# Patient Record
Sex: Female | Born: 1945 | Race: White | Hispanic: No | Smoking: Former smoker
Health system: Southern US, Community
[De-identification: ages and names within clinical notes are randomized; demographics above are authoritative.]

## PROBLEM LIST (undated history)

## (undated) DIAGNOSIS — F419 Anxiety disorder, unspecified: Secondary | ICD-10-CM

## (undated) DIAGNOSIS — M8580 Other specified disorders of bone density and structure, unspecified site: Secondary | ICD-10-CM

## (undated) DIAGNOSIS — M109 Gout, unspecified: Secondary | ICD-10-CM

## (undated) DIAGNOSIS — F32A Depression, unspecified: Secondary | ICD-10-CM

## (undated) DIAGNOSIS — T4145XA Adverse effect of unspecified anesthetic, initial encounter: Secondary | ICD-10-CM

## (undated) DIAGNOSIS — E119 Type 2 diabetes mellitus without complications: Secondary | ICD-10-CM

## (undated) DIAGNOSIS — L900 Lichen sclerosus et atrophicus: Secondary | ICD-10-CM

## (undated) DIAGNOSIS — R001 Bradycardia, unspecified: Secondary | ICD-10-CM

## (undated) DIAGNOSIS — R0600 Dyspnea, unspecified: Secondary | ICD-10-CM

## (undated) DIAGNOSIS — E785 Hyperlipidemia, unspecified: Secondary | ICD-10-CM

## (undated) DIAGNOSIS — I10 Essential (primary) hypertension: Secondary | ICD-10-CM

## (undated) DIAGNOSIS — M545 Low back pain, unspecified: Secondary | ICD-10-CM

## (undated) DIAGNOSIS — I499 Cardiac arrhythmia, unspecified: Secondary | ICD-10-CM

## (undated) DIAGNOSIS — F329 Major depressive disorder, single episode, unspecified: Secondary | ICD-10-CM

## (undated) DIAGNOSIS — K219 Gastro-esophageal reflux disease without esophagitis: Secondary | ICD-10-CM

## (undated) DIAGNOSIS — J45909 Unspecified asthma, uncomplicated: Secondary | ICD-10-CM

## (undated) DIAGNOSIS — N393 Stress incontinence (female) (male): Secondary | ICD-10-CM

## (undated) DIAGNOSIS — E669 Obesity, unspecified: Secondary | ICD-10-CM

## (undated) DIAGNOSIS — T8859XA Other complications of anesthesia, initial encounter: Secondary | ICD-10-CM

## (undated) HISTORY — DX: Lichen sclerosus et atrophicus: L90.0

## (undated) HISTORY — DX: Low back pain: M54.5

## (undated) HISTORY — PX: ABDOMINAL HYSTERECTOMY: SHX81

## (undated) HISTORY — DX: Hyperlipidemia, unspecified: E78.5

## (undated) HISTORY — DX: Gout, unspecified: M10.9

## (undated) HISTORY — DX: Bradycardia, unspecified: R00.1

## (undated) HISTORY — DX: Essential (primary) hypertension: I10

## (undated) HISTORY — DX: Depression, unspecified: F32.A

## (undated) HISTORY — DX: Type 2 diabetes mellitus without complications: E11.9

## (undated) HISTORY — DX: Anxiety disorder, unspecified: F41.9

## (undated) HISTORY — DX: Stress incontinence (female) (male): N39.3

## (undated) HISTORY — PX: CHOLECYSTECTOMY: SHX55

## (undated) HISTORY — PX: INCONTINENCE SURGERY: SHX676

## (undated) HISTORY — PX: EYE SURGERY: SHX253

## (undated) HISTORY — DX: Major depressive disorder, single episode, unspecified: F32.9

## (undated) HISTORY — DX: Obesity, unspecified: E66.9

## (undated) HISTORY — DX: Other specified disorders of bone density and structure, unspecified site: M85.80

## (undated) HISTORY — DX: Gastro-esophageal reflux disease without esophagitis: K21.9

## (undated) HISTORY — DX: Unspecified asthma, uncomplicated: J45.909

## (undated) HISTORY — PX: SINUS EXPLORATION: SHX5214

## (undated) HISTORY — DX: Low back pain, unspecified: M54.50

---

## 2006-10-23 ENCOUNTER — Ambulatory Visit: Payer: Self-pay | Admitting: Gastroenterology

## 2008-03-22 ENCOUNTER — Other Ambulatory Visit: Payer: Self-pay

## 2008-03-22 ENCOUNTER — Emergency Department: Payer: Self-pay | Admitting: Emergency Medicine

## 2010-10-05 ENCOUNTER — Ambulatory Visit: Payer: Self-pay | Admitting: Emergency Medicine

## 2011-06-01 ENCOUNTER — Emergency Department: Payer: Self-pay | Admitting: Emergency Medicine

## 2013-09-18 ENCOUNTER — Ambulatory Visit: Payer: Self-pay | Admitting: Podiatry

## 2013-10-08 ENCOUNTER — Ambulatory Visit: Payer: Self-pay | Admitting: Podiatry

## 2014-04-28 ENCOUNTER — Ambulatory Visit: Payer: Self-pay

## 2014-05-06 ENCOUNTER — Ambulatory Visit: Payer: Self-pay

## 2014-05-24 ENCOUNTER — Ambulatory Visit: Payer: Self-pay

## 2014-06-24 ENCOUNTER — Ambulatory Visit: Payer: Self-pay

## 2014-10-10 ENCOUNTER — Encounter (HOSPITAL_BASED_OUTPATIENT_CLINIC_OR_DEPARTMENT_OTHER): Payer: Medicare HMO

## 2014-11-10 DIAGNOSIS — M654 Radial styloid tenosynovitis [de Quervain]: Secondary | ICD-10-CM | POA: Diagnosis not present

## 2014-12-16 DIAGNOSIS — N182 Chronic kidney disease, stage 2 (mild): Secondary | ICD-10-CM | POA: Diagnosis not present

## 2014-12-16 DIAGNOSIS — I129 Hypertensive chronic kidney disease with stage 1 through stage 4 chronic kidney disease, or unspecified chronic kidney disease: Secondary | ICD-10-CM | POA: Diagnosis not present

## 2014-12-16 DIAGNOSIS — K649 Unspecified hemorrhoids: Secondary | ICD-10-CM | POA: Diagnosis not present

## 2015-02-18 DIAGNOSIS — K602 Anal fissure, unspecified: Secondary | ICD-10-CM | POA: Diagnosis not present

## 2015-03-09 DIAGNOSIS — I119 Hypertensive heart disease without heart failure: Secondary | ICD-10-CM | POA: Diagnosis not present

## 2015-03-09 DIAGNOSIS — E782 Mixed hyperlipidemia: Secondary | ICD-10-CM | POA: Diagnosis not present

## 2015-03-09 DIAGNOSIS — I493 Ventricular premature depolarization: Secondary | ICD-10-CM | POA: Diagnosis not present

## 2015-03-09 DIAGNOSIS — I10 Essential (primary) hypertension: Secondary | ICD-10-CM | POA: Diagnosis not present

## 2015-03-17 DIAGNOSIS — I129 Hypertensive chronic kidney disease with stage 1 through stage 4 chronic kidney disease, or unspecified chronic kidney disease: Secondary | ICD-10-CM | POA: Diagnosis not present

## 2015-03-17 DIAGNOSIS — E119 Type 2 diabetes mellitus without complications: Secondary | ICD-10-CM | POA: Diagnosis not present

## 2015-03-17 DIAGNOSIS — N182 Chronic kidney disease, stage 2 (mild): Secondary | ICD-10-CM | POA: Diagnosis not present

## 2015-03-17 DIAGNOSIS — R7301 Impaired fasting glucose: Secondary | ICD-10-CM | POA: Diagnosis not present

## 2015-06-01 ENCOUNTER — Ambulatory Visit (INDEPENDENT_AMBULATORY_CARE_PROVIDER_SITE_OTHER): Payer: Medicare HMO | Admitting: Family Medicine

## 2015-06-01 ENCOUNTER — Ambulatory Visit: Payer: Self-pay | Admitting: Unknown Physician Specialty

## 2015-06-01 ENCOUNTER — Encounter: Payer: Self-pay | Admitting: Family Medicine

## 2015-06-01 VITALS — BP 154/81 | HR 59 | Temp 97.4°F | Ht 59.0 in | Wt 148.5 lb

## 2015-06-01 DIAGNOSIS — I10 Essential (primary) hypertension: Secondary | ICD-10-CM

## 2015-06-01 DIAGNOSIS — L299 Pruritus, unspecified: Secondary | ICD-10-CM | POA: Diagnosis not present

## 2015-06-01 DIAGNOSIS — F411 Generalized anxiety disorder: Secondary | ICD-10-CM

## 2015-06-01 DIAGNOSIS — K602 Anal fissure, unspecified: Secondary | ICD-10-CM | POA: Diagnosis not present

## 2015-06-01 MED ORDER — CLOTRIMAZOLE-BETAMETHASONE 1-0.05 % EX LOTN: TOPICAL_LOTION | Freq: Two times a day (BID) | CUTANEOUS | Status: DC

## 2015-06-01 MED ORDER — DIAZEPAM 2 MG PO TABS: 2.0000 mg | ORAL_TABLET | Freq: Every day | ORAL | Status: DC

## 2015-06-01 MED ORDER — VENLAFAXINE HCL ER 37.5 MG PO CP24: 37.5000 mg | ORAL_CAPSULE | Freq: Every day | ORAL | Status: DC

## 2015-06-01 MED ORDER — CLOBETASOL PROPIONATE 0.05 % EX OINT: 1.0000 "application " | TOPICAL_OINTMENT | Freq: Two times a day (BID) | CUTANEOUS | Status: DC

## 2015-06-01 NOTE — Assessment & Plan Note (Signed)
Long discussion with patient about how valium is dangerous in elderly patients, and that I don't treat daily anxiety with benzos without a preventative medicine. She is willing to try a preventative medicine, will try effexor, with the possibility of switching to cymbalta due to vasomotor symptoms. Will continue valium for now with the goal of slowly weaning off. Continue to monitor. Follow up in 1 month.

## 2015-06-01 NOTE — Assessment & Plan Note (Signed)
Refill of steroid cream given. Not to be used on the face.

## 2015-06-01 NOTE — Assessment & Plan Note (Signed)
Elevated today. Patient states that cardiology manages BP and that she doesn't want any one else touching it. Encouraged her to call cardiologist.

## 2015-06-01 NOTE — Patient Instructions (Signed)
DASH Eating Plan °DASH stands for "Dietary Approaches to Stop Hypertension." The DASH eating plan is a healthy eating plan that has been shown to reduce high blood pressure (hypertension). Additional health benefits may include reducing the risk of type 2 diabetes mellitus, heart disease, and stroke. The DASH eating plan may also help with weight loss. °WHAT DO I NEED TO KNOW ABOUT THE DASH EATING PLAN? °For the DASH eating plan, you will follow these general guidelines: °· Choose foods with a percent daily value for sodium of less than 5% (as listed on the food label). °· Use salt-free seasonings or herbs instead of table salt or sea salt. °· Check with your health care provider or pharmacist before using salt substitutes. °· Eat lower-sodium products, often labeled as "lower sodium" or "no salt added." °· Eat fresh foods. °· Eat more vegetables, fruits, and low-fat dairy products. °· Choose whole grains. Look for the word "whole" as the first word in the ingredient list. °· Choose fish and skinless chicken or turkey more often than red meat. Limit fish, poultry, and meat to 6 oz (170 g) each day. °· Limit sweets, desserts, sugars, and sugary drinks. °· Choose heart-healthy fats. °· Limit cheese to 1 oz (28 g) per day. °· Eat more home-cooked food and less restaurant, buffet, and fast food. °· Limit fried foods. °· Cook foods using methods other than frying. °· Limit canned vegetables. If you do use them, rinse them well to decrease the sodium. °· When eating at a restaurant, ask that your food be prepared with less salt, or no salt if possible. °WHAT FOODS CAN I EAT? °Seek help from a dietitian for individual calorie needs. °Grains °Whole grain or whole wheat bread. Brown rice. Whole grain or whole wheat pasta. Quinoa, bulgur, and whole grain cereals. Low-sodium cereals. Corn or whole wheat flour tortillas. Whole grain cornbread. Whole grain crackers. Low-sodium crackers. °Vegetables °Fresh or frozen vegetables  (raw, steamed, roasted, or grilled). Low-sodium or reduced-sodium tomato and vegetable juices. Low-sodium or reduced-sodium tomato sauce and paste. Low-sodium or reduced-sodium canned vegetables.  °Fruits °All fresh, canned (in natural juice), or frozen fruits. °Meat and Other Protein Products °Ground beef (85% or leaner), grass-fed beef, or beef trimmed of fat. Skinless chicken or turkey. Ground chicken or turkey. Pork trimmed of fat. All fish and seafood. Eggs. Dried beans, peas, or lentils. Unsalted nuts and seeds. Unsalted canned beans. °Dairy °Low-fat dairy products, such as skim or 1% milk, 2% or reduced-fat cheeses, low-fat ricotta or cottage cheese, or plain low-fat yogurt. Low-sodium or reduced-sodium cheeses. °Fats and Oils °Tub margarines without trans fats. Light or reduced-fat mayonnaise and salad dressings (reduced sodium). Avocado. Safflower, olive, or canola oils. Natural peanut or almond butter. °Other °Unsalted popcorn and pretzels. °The items listed above may not be a complete list of recommended foods or beverages. Contact your dietitian for more options. °WHAT FOODS ARE NOT RECOMMENDED? °Grains °White bread. White pasta. White rice. Refined cornbread. Bagels and croissants. Crackers that contain trans fat. °Vegetables °Creamed or fried vegetables. Vegetables in a cheese sauce. Regular canned vegetables. Regular canned tomato sauce and paste. Regular tomato and vegetable juices. °Fruits °Dried fruits. Canned fruit in light or heavy syrup. Fruit juice. °Meat and Other Protein Products °Fatty cuts of meat. Ribs, chicken wings, bacon, sausage, bologna, salami, chitterlings, fatback, hot dogs, bratwurst, and packaged luncheon meats. Salted nuts and seeds. Canned beans with salt. °Dairy °Whole or 2% milk, cream, half-and-half, and cream cheese. Whole-fat or sweetened yogurt. Full-fat   cheeses or blue cheese. Nondairy creamers and whipped toppings. Processed cheese, cheese spreads, or cheese  curds. °Condiments °Onion and garlic salt, seasoned salt, table salt, and sea salt. Canned and packaged gravies. Worcestershire sauce. Tartar sauce. Barbecue sauce. Teriyaki sauce. Soy sauce, including reduced sodium. Steak sauce. Fish sauce. Oyster sauce. Cocktail sauce. Horseradish. Ketchup and mustard. Meat flavorings and tenderizers. Bouillon cubes. Hot sauce. Tabasco sauce. Marinades. Taco seasonings. Relishes. °Fats and Oils °Butter, stick margarine, lard, shortening, ghee, and bacon fat. Coconut, palm kernel, or palm oils. Regular salad dressings. °Other °Pickles and olives. Salted popcorn and pretzels. °The items listed above may not be a complete list of foods and beverages to avoid. Contact your dietitian for more information. °WHERE CAN I FIND MORE INFORMATION? °National Heart, [employer], and [employer] Institute: www.nhlbi.nih.gov/health/health-topics/topics/dash/ °Document Released: 09/29/2011 Document Revised: 02/24/2014 Document Reviewed: 08/14/2013 °ExitCare® Patient Information ©2015 [employer], [employer]. This information is not intended to replace advice given to you by your health care provider. Make sure you discuss any questions you have with your health care provider. ° °

## 2015-06-01 NOTE — Assessment & Plan Note (Signed)
Stable on current regimen. Continue current regimen. Continue to monitor.  

## 2015-06-01 NOTE — Progress Notes (Signed)
BP 154/81 mmHg  Pulse 59  Temp(Src) 97.4 F (36.3 C)  Ht 4\' 11"  (1.499 m)  Wt 148 lb 8 oz (67.359 kg)  BMI 29.98 kg/m2  SpO2 99%   Subjective:    Patient ID: Martha Singleton, female    DOB: 05-18-1946, 69 y.o.   MRN: [id number]  HPI: Martha Singleton is a 69 y.o. female  Chief Complaint  Patient presents with  . itchy ear    patient has had a script in the past Clotrimazole and betamethasone, she would like a refill in a 90day supply  . Hearing Loss  . Anal Fissure    patient would like a 90 day supply on clobetasol ointment   HYPERTENSION- has been going to see the cardiologist. Does not want 2 doctors dealing with her blood pressure. She is going to be seeing him for that and doesn't want [place] to change anything Hypertension status: stable  Satisfied with current treatment? yes Duration of hypertension: chronic BP monitoring frequency:  not checking BP medication side effects:  no Medication compliance: excellent compliance Aspirin: no Recurrent headaches: no Visual changes: no Palpitations: yes Dyspnea: no Chest pain: no Lower extremity edema: no Dizzy/lightheaded: no  ANXIETY/STRESS- was on 20mg  of the citalopram, but notes that it made her arm shake, so she stopped taking it. She weaned herself off. Anxiety happens at different times. Not in the AM or just at night. Feels like her heart races and skips beats. When she was on the premarin, she felt good. But now feels out of control.  Duration:uncontrolled Anxious mood: yes  Excessive worrying: yes Irritability: no  Sweating: no Nausea: no Palpitations:yes Hyperventilation: no Panic attacks: yes Agoraphobia: no  Obscessions/compulsions: no  Medication has been working well for her ears and it has been working well. Has been under good control for her.  Anal fissure has been bleeding a bit- usually happens with 2+ BM a day, needs medication.   Relevant past medical, surgical, family and social history reviewed  and updated as indicated. Interim medical history since our last visit reviewed. Allergies and medications reviewed and updated.  Review of Systems  Constitutional: Negative.   Respiratory: Negative.   Cardiovascular: Negative.   Endocrine: Negative.   Psychiatric/Behavioral: Negative for suicidal ideas, hallucinations, behavioral problems, confusion, sleep disturbance, self-injury, dysphoric mood, decreased concentration and agitation. The patient is nervous/anxious. The patient is not hyperactive.     Per HPI unless specifically indicated above     Objective:    BP 154/81 mmHg  Pulse 59  Temp(Src) 97.4 F (36.3 C)  Ht 4\' 11"  (1.499 m)  Wt 148 lb 8 oz (67.359 kg)  BMI 29.98 kg/m2  SpO2 99%  Wt Readings from Last 3 Encounters:  06/01/15 148 lb 8 oz (67.359 kg)  03/17/15 145 lb (65.772 kg)    Physical Exam  Constitutional: She is oriented to person, place, and time. She appears well-developed and well-nourished. No distress.  HENT:  Head: Normocephalic and atraumatic.  Right Ear: Hearing normal.  Left Ear: Hearing normal.  Nose: Nose normal.  Eyes: Conjunctivae and lids are normal. Right eye exhibits no discharge. Left eye exhibits no discharge. No scleral icterus.  Cardiovascular: Normal rate, regular rhythm and normal heart sounds.  Exam reveals no gallop and no friction rub.   No murmur heard. Pulmonary/Chest: Effort normal and breath sounds normal. No respiratory distress. She has no wheezes. She has no rales.  Musculoskeletal: Normal range of motion.  Neurological: She is alert  and oriented to person, place, and time.  Skin: Skin is warm, dry and intact. No rash noted. No erythema. No pallor.  Psychiatric: She has a normal mood and affect. Her speech is normal and behavior is normal. Judgment and thought content normal. Cognition and memory are normal.  Nursing note and vitals reviewed.   No results found for this or any previous visit.    Assessment & Plan:    Problem List Items Addressed This Visit      Cardiovascular and Mediastinum   Essential hypertension    Elevated today. Patient states that cardiology manages BP and that she doesn't want any one else touching it. Encouraged her to call cardiologist.       Relevant Orders   Ambulatory referral to Cardiology     Digestive   Anal fissure    Stable on current regimen. Continue current regimen. Continue to monitor.         Other   Generalized anxiety disorder - Primary    Long discussion with patient about how valium is dangerous in elderly patients, and that I don't treat daily anxiety with benzos without a preventative medicine. She is willing to try a preventative medicine, will try effexor, with the possibility of switching to cymbalta due to vasomotor symptoms. Will continue valium for now with the goal of slowly weaning off. Continue to monitor. Follow up in 1 month.       Itchy skin    Refill of steroid cream given. Not to be used on the face.           Follow up plan: Return in about 4 weeks (around 06/29/2015).

## 2015-06-02 ENCOUNTER — Telehealth: Payer: Self-pay | Admitting: Family Medicine

## 2015-06-02 MED ORDER — VENLAFAXINE HCL ER 37.5 MG PO CP24: 37.5000 mg | ORAL_CAPSULE | Freq: Every day | ORAL | Status: DC

## 2015-06-02 NOTE — Telephone Encounter (Signed)
Rx sent to her pharmacy 

## 2015-06-02 NOTE — Telephone Encounter (Signed)
Pt called in and would like to have rx sent to [facility] [address] instead of [facility] until we make sure the medication is working.

## 2015-06-18 ENCOUNTER — Other Ambulatory Visit: Payer: Self-pay | Admitting: Family Medicine

## 2015-07-02 ENCOUNTER — Ambulatory Visit: Payer: Medicare HMO | Admitting: Family Medicine

## 2015-07-09 ENCOUNTER — Encounter: Payer: Self-pay | Admitting: Family Medicine

## 2015-07-09 ENCOUNTER — Ambulatory Visit (INDEPENDENT_AMBULATORY_CARE_PROVIDER_SITE_OTHER): Payer: Commercial Managed Care - HMO | Admitting: Family Medicine

## 2015-07-09 VITALS — BP 166/78 | HR 70 | Wt 150.0 lb

## 2015-07-09 DIAGNOSIS — R001 Bradycardia, unspecified: Secondary | ICD-10-CM | POA: Diagnosis not present

## 2015-07-09 DIAGNOSIS — T148 Other injury of unspecified body region: Secondary | ICD-10-CM | POA: Diagnosis not present

## 2015-07-09 DIAGNOSIS — F411 Generalized anxiety disorder: Secondary | ICD-10-CM | POA: Diagnosis not present

## 2015-07-09 DIAGNOSIS — I10 Essential (primary) hypertension: Secondary | ICD-10-CM

## 2015-07-09 DIAGNOSIS — W57XXXA Bitten or stung by nonvenomous insect and other nonvenomous arthropods, initial encounter: Secondary | ICD-10-CM | POA: Diagnosis not present

## 2015-07-09 MED ORDER — CLOTRIMAZOLE-BETAMETHASONE 1-0.05 % EX LOTN: TOPICAL_LOTION | Freq: Two times a day (BID) | CUTANEOUS | Status: DC

## 2015-07-09 MED ORDER — DOXYCYCLINE HYCLATE 100 MG PO TABS: 100.0000 mg | ORAL_TABLET | Freq: Two times a day (BID) | ORAL | Status: DC

## 2015-07-09 MED ORDER — VENLAFAXINE HCL 25 MG PO TABS: 25.0000 mg | ORAL_TABLET | Freq: Two times a day (BID) | ORAL | Status: DC

## 2015-07-09 NOTE — Patient Instructions (Signed)
Lyme Disease You may have been bitten by a tick and are to watch for the development of Lyme Disease. Lyme Disease is an infection that is caused by a bacteria The bacteria causing this disease is named Borreilia burgdorferi. If a tick is infected with this bacteria and then bites you, then Lyme Disease may occur. These ticks are carried by deer and rodents such as rabbits and mice and infest grassy as well as forested areas. Fortunately most tick bites do not cause Lyme Disease.  Lyme Disease is easier to prevent than to treat. First, covering your legs with clothing when walking in areas where ticks are possibly abundant will prevent their attachment because ticks tend to stay within inches of the ground. Second, using insecticides containing DEET can be applied on skin or clothing. Last, because it takes about 12 to 24 hours for the tick to transmit the disease after attachment to the human host, you should inspect your body for ticks twice a day when you are in areas where Lyme Disease is common. You must look thoroughly when searching for ticks. The Ixodes tick that carries Lyme Disease is very small. It is around the size of a sesame seed (picture of tick is not actual size). Removal is best done by grasping the tick by the head and pulling it out. Do not to squeeze the body of the tick. This could inject the infecting bacteria into the bite site. Wash the area of the bite with an antiseptic solution after removal.  Lyme Disease is a disease that may affect many body systems. Because of the small size of the biting tick, most people do not notice being bitten. The first sign of an infection is usually a round red rash that extends out from the center of the tick bite. The center of the lesion may be blood colored (hemorrhagic) or have tiny blisters (vesicular). Most lesions have bright red outer borders and partial central clearing. This rash may extend out many inches in diameter, and multiple lesions may  be present. Other symptoms such as fatigue, headaches, chills and fever, general achiness and swelling of lymph glands may also occur. If this first stage of the disease is left untreated, these symptoms may gradually resolve by themselves, or progressive symptoms may occur because of spread of infection to other areas of the body.  Follow up with your caregiver to have testing and treatment if you have a tick bite and you develop any of the above complaints. Your caregiver may recommend preventative (prophylactic) medications which kill bacteria (antibiotics). Once a diagnosis of Lyme Disease is made, antibiotic treatment is highly likely to cure the disease. Effective treatment of late stage Lyme Disease may require longer courses of antibiotic therapy.  MAKE SURE YOU:   Understand these instructions.  Will watch your condition.  Will get help right away if you are not doing well or get worse. Document Released: 01/16/2001 Document Revised: 01/02/2012 Document Reviewed: 03/20/2009 ExitCare Patient Information 2015 [employer], LLC. This information is not intended to replace advice given to you by your health care provider. Make sure you discuss any questions you have with your health care provider.  

## 2015-07-09 NOTE — Assessment & Plan Note (Signed)
Patient was very bradycardic when she come in and was essentially asymptomatic with the exception of some light headedness. When she lay down,  HR increased. No changes on EKG from prior. Spoke to her cardiologist, [name]. He will see her on Monday for follow up. Advised her to walk on pulse ox around the office 2x, if HR stabilizes, likely due to PVCs- HR stabilized and got up to 100bpm. She felt well. Discussed warning signs for which she should go to the ER/call 911.

## 2015-07-09 NOTE — Assessment & Plan Note (Signed)
Did not do well on the long-acting effexor, will try lower dose short acting medicine. Will take 25mg  qAM x first couple of days to see how she does on it, and then will increase to 1 BID. Recheck in 1 month.

## 2015-07-09 NOTE — Assessment & Plan Note (Signed)
Will check for tick-bourne illnesses, but given that she had a bulls eye and is now having migrating arthralgias, we will treat her with doxy. Advised her that she may feel worse over the next couple of days when she starts the medication, she is aware.

## 2015-07-09 NOTE — Assessment & Plan Note (Signed)
Elevated again today- wants to have this managed by cardiology only. Seeing them on Monday.

## 2015-07-09 NOTE — Progress Notes (Signed)
BP 166/78 mmHg  Pulse 70  Wt 150 lb (68.04 kg)  SpO2 97%   Subjective:    Patient ID: Martha Singleton, female    DOB: [birth year], 69 y.o.   MRN: [id number]  HPI: Martha Singleton is a 69 y.o. female  No chief complaint on file.  Effexor made her really sleepy, slept for 2 days, doesn't want to do the ER, but would be OK trying a short acting medicine.  ANXIETY/STRESS Duration: Chronic Status:stable Anxious mood: yes  Excessive worrying: yes Irritability: no  Sweating: no Nausea: no Palpitations:no Hyperventilation: no Panic attacks: no Agoraphobia: no  Obscessions/compulsions: no Depressed mood: no .Anhedonia: no Weight changes: no Insomnia: no   Hypersomnia: no Fatigue/loss of energy: yes Feelings of worthlessness: no Feelings of guilt: no Impaired concentration/indecisiveness: no Suicidal ideations: no  Crying spells: no Recent Stressors/Life Changes: no   Got a tick bite at the beginning of the summer, had a bull's eye ring, had pain in her R leg, felt like nerve pain, thought it was shingles, but never was red,   Relevant past medical, surgical, family and social history reviewed and updated as indicated. Interim medical history since our last visit reviewed. Allergies and medications reviewed and updated.  Review of Systems  Constitutional: Negative.   Respiratory: Negative.   Cardiovascular: Negative.   Gastrointestinal: Negative.   Musculoskeletal: Negative.   Psychiatric/Behavioral: Negative.     Per HPI unless specifically indicated above     Objective:    BP 166/78 mmHg  Pulse 70  Wt 150 lb (68.04 kg)  SpO2 97%  Wt Readings from Last 3 Encounters:  07/09/15 150 lb (68.04 kg)  06/01/15 148 lb 8 oz (67.359 kg)  03/17/15 145 lb (65.772 kg)    Physical Exam  Constitutional: She is oriented to person, place, and time. She appears well-developed and well-nourished. No distress.  HENT:  Head: Normocephalic and atraumatic.  Right Ear: Hearing  normal.  Left Ear: Hearing normal.  Nose: Nose normal.  Eyes: Conjunctivae and lids are normal. Right eye exhibits no discharge. Left eye exhibits no discharge. No scleral icterus.  Cardiovascular: Intact distal pulses.  Exam reveals no gallop and no friction rub.   Murmur heard. Numerous PVCs  Pulmonary/Chest: Effort normal. No respiratory distress. She has no wheezes. She has no rales. She exhibits no tenderness.  Musculoskeletal: Normal range of motion.  Neurological: She is alert and oriented to person, place, and time.  Skin: Skin is warm, dry and intact. No rash noted. No erythema. No pallor.  Psychiatric: She has a normal mood and affect. Her speech is normal and behavior is normal. Judgment and thought content normal. Cognition and memory are normal.  Nursing note and vitals reviewed.   No results found for this or any previous visit.    Assessment & Plan:   Problem List Items Addressed This Visit      Cardiovascular and Mediastinum   Essential hypertension    Elevated again today- wants to have this managed by cardiology only. Seeing them on Monday.         Musculoskeletal and Integument   Tick bite    Will check for tick-bourne illnesses, but given that she had a bulls eye and is now having migrating arthralgias, we will treat her with doxy. Advised her that she may feel worse over the next couple of days when she starts the medication, she is aware.       Relevant Orders   Comprehensive metabolic  panel   Lyme Ab/Western Blot Reflex   Rocky mtn spotted fvr abs pnl(IgG+IgM)   Babesia microti Antibody Panel   Ehrlichia Antibody Panel     Other   Generalized anxiety disorder    Did not do well on the long-acting effexor, will try lower dose short acting medicine. Will take 25mg  qAM x first couple of days to see how she does on it, and then will increase to 1 BID. Recheck in 1 month.       Bradycardia - Primary    Patient was very bradycardic when she come in and  was essentially asymptomatic with the exception of some light headedness. When she lay down,  HR increased. No changes on EKG from prior. Spoke to her cardiologist, [name]. He will see her on Monday for follow up. Advised her to walk on pulse ox around the office 2x, if HR stabilizes, likely due to PVCs- HR stabilized and got up to 100bpm. She felt well. Discussed warning signs for which she should go to the ER/call 911.       Relevant Orders   EKG 12-Lead (Completed)   CBC With Differential/Platelet   Comprehensive metabolic panel   TSH       Follow up plan: Return in about 4 weeks (around 08/06/2015) for Anxiety follow up.      4

## 2015-07-10 LAB — COMPREHENSIVE METABOLIC PANEL
ALT: 19 IU/L (ref 0–32)
AST: 17 IU/L (ref 0–40)
Albumin/Globulin Ratio: 1.6 (ref 1.1–2.5)
Albumin: 4.3 g/dL (ref 3.6–4.8)
Alkaline Phosphatase: 82 IU/L (ref 39–117)
BUN/Creatinine Ratio: 21 (ref 11–26)
BUN: 18 mg/dL (ref 8–27)
Bilirubin Total: 0.9 mg/dL (ref 0.0–1.2)
CALCIUM: 9.7 mg/dL (ref 8.7–10.3)
CO2: 25 mmol/L (ref 18–29)
CREATININE: 0.86 mg/dL (ref 0.57–1.00)
Chloride: 100 mmol/L (ref 97–108)
GFR calc Af Amer: 80 mL/min/{1.73_m2} (ref >59)
GFR, EST NON AFRICAN AMERICAN: 69 mL/min/{1.73_m2} (ref >59)
GLOBULIN, TOTAL: 2.7 g/dL (ref 1.5–4.5)
GLUCOSE: 98 mg/dL (ref 65–99)
Potassium: 4.1 mmol/L (ref 3.5–5.2)
SODIUM: 142 mmol/L (ref 134–144)
Total Protein: 7 g/dL (ref 6.0–8.5)

## 2015-07-10 LAB — CBC WITH DIFFERENTIAL/PLATELET
HEMATOCRIT: 40.5 % (ref 34.0–46.6)
Hemoglobin: 13.9 g/dL (ref 11.1–15.9)
LYMPHS ABS: 2.7 10*3/uL (ref 0.7–3.1)
LYMPHS: 33 %
MCH: 30.3 pg (ref 26.6–33.0)
MCHC: 34.3 g/dL (ref 31.5–35.7)
MCV: 88 fL (ref 79–97)
MID (ABSOLUTE): 0.7 10*3/uL (ref 0.1–1.6)
MID: 8 %
Neutrophils Absolute: 5 10*3/uL (ref 1.4–7.0)
Neutrophils: 59 %
Platelets: 295 10*3/uL (ref 150–379)
RBC: 4.58 x10E6/uL (ref 3.77–5.28)
RDW: 13.7 % (ref 12.3–15.4)
WBC: 8.4 10*3/uL (ref 3.4–10.8)

## 2015-07-10 LAB — EHRLICHIA ANTIBODY PANEL
E. CHAFFEENSIS (HME) IGM TITER: NEGATIVE
E. CHAFFEENSIS IGG AB: NEGATIVE
HGE IGG TITER: NEGATIVE
HGE IgM Titer: NEGATIVE

## 2015-07-10 LAB — TSH: TSH: 1.47 u[IU]/mL (ref 0.450–4.500)

## 2015-07-10 LAB — LYME AB/WESTERN BLOT REFLEX

## 2015-07-11 LAB — ROCKY MTN SPOTTED FVR ABS PNL(IGG+IGM)
RMSF IgG: NEGATIVE
RMSF IgM: 1.05 index — ABNORMAL HIGH (ref 0.00–0.89)

## 2015-07-11 LAB — BABESIA MICROTI ANTIBODY PANEL
Babesia microti IgG: <1:10 {titer}
Babesia microti IgM: <1:10 {titer}

## 2015-07-13 DIAGNOSIS — I10 Essential (primary) hypertension: Secondary | ICD-10-CM | POA: Diagnosis not present

## 2015-07-13 DIAGNOSIS — R001 Bradycardia, unspecified: Secondary | ICD-10-CM | POA: Diagnosis not present

## 2015-07-13 DIAGNOSIS — E782 Mixed hyperlipidemia: Secondary | ICD-10-CM | POA: Diagnosis not present

## 2015-07-13 DIAGNOSIS — I38 Endocarditis, valve unspecified: Secondary | ICD-10-CM | POA: Diagnosis not present

## 2015-07-13 DIAGNOSIS — I119 Hypertensive heart disease without heart failure: Secondary | ICD-10-CM | POA: Diagnosis not present

## 2015-07-13 DIAGNOSIS — I493 Ventricular premature depolarization: Secondary | ICD-10-CM | POA: Diagnosis not present

## 2015-07-16 DIAGNOSIS — I493 Ventricular premature depolarization: Secondary | ICD-10-CM | POA: Diagnosis not present

## 2015-07-16 DIAGNOSIS — R001 Bradycardia, unspecified: Secondary | ICD-10-CM | POA: Diagnosis not present

## 2015-07-20 DIAGNOSIS — I10 Essential (primary) hypertension: Secondary | ICD-10-CM | POA: Diagnosis not present

## 2015-07-20 DIAGNOSIS — I493 Ventricular premature depolarization: Secondary | ICD-10-CM | POA: Diagnosis not present

## 2015-07-20 DIAGNOSIS — R001 Bradycardia, unspecified: Secondary | ICD-10-CM | POA: Diagnosis not present

## 2015-07-20 DIAGNOSIS — E782 Mixed hyperlipidemia: Secondary | ICD-10-CM | POA: Diagnosis not present

## 2015-07-27 ENCOUNTER — Telehealth: Payer: Self-pay

## 2015-07-27 ENCOUNTER — Telehealth: Payer: Self-pay | Admitting: Family Medicine

## 2015-07-27 MED ORDER — LOSARTAN POTASSIUM-HCTZ 50-12.5 MG PO TABS: 1.0000 | ORAL_TABLET | Freq: Every day | ORAL | Status: DC

## 2015-07-27 NOTE — Telephone Encounter (Signed)
I filled Losartan, not Diazepam.  Since she is with Dr. Wynetta Emery now, I am not comfortable prescribing Diazepam

## 2015-07-27 NOTE — Telephone Encounter (Signed)
Losartan: [facility] 90 day supply  Diazepam: [facility]

## 2015-07-27 NOTE — Telephone Encounter (Signed)
Did you send the Diazepam to [facility]? It was to go to [place]

## 2015-07-27 NOTE — Telephone Encounter (Signed)
Done. Thanks.

## 2015-07-27 NOTE — Telephone Encounter (Signed)
Please refill Diazepam

## 2015-07-29 ENCOUNTER — Other Ambulatory Visit: Payer: Self-pay | Admitting: Family Medicine

## 2015-07-29 MED ORDER — DIAZEPAM 2 MG PO TABS: 2.0000 mg | ORAL_TABLET | Freq: Every day | ORAL | Status: DC

## 2015-07-29 NOTE — Telephone Encounter (Signed)
Unfortunately, I do not send any controlled substances to mail order.

## 2015-07-29 NOTE — Telephone Encounter (Signed)
Patient called, she states that she can get the diazepam from mail order cheaper than local pharmacy. She would like to know if you could write this for her and specify to fill monthly it would save her a lot of money.

## 2015-07-29 NOTE — Telephone Encounter (Signed)
Spoke with Dr.Johnson, she does not send any controlled substances to mail order. LVM to notify patient that it will not be sent to mail order.

## 2015-08-06 ENCOUNTER — Other Ambulatory Visit: Payer: Self-pay | Admitting: Unknown Physician Specialty

## 2015-08-06 NOTE — Telephone Encounter (Signed)
I think you are seeing her now?

## 2015-08-07 ENCOUNTER — Ambulatory Visit: Payer: Medicare HMO | Admitting: Family Medicine

## 2015-08-17 ENCOUNTER — Other Ambulatory Visit: Payer: Self-pay | Admitting: Unknown Physician Specialty

## 2015-08-18 NOTE — Telephone Encounter (Signed)
yours

## 2015-08-25 ENCOUNTER — Ambulatory Visit: Payer: Commercial Managed Care - HMO | Admitting: Family Medicine

## 2015-08-27 ENCOUNTER — Ambulatory Visit (INDEPENDENT_AMBULATORY_CARE_PROVIDER_SITE_OTHER): Payer: Commercial Managed Care - HMO | Admitting: Family Medicine

## 2015-08-27 ENCOUNTER — Encounter: Payer: Self-pay | Admitting: Family Medicine

## 2015-08-27 VITALS — BP 163/69 | HR 80 | Temp 98.0°F | Wt 156.0 lb

## 2015-08-27 DIAGNOSIS — F411 Generalized anxiety disorder: Secondary | ICD-10-CM | POA: Diagnosis not present

## 2015-08-27 DIAGNOSIS — Z23 Encounter for immunization: Secondary | ICD-10-CM

## 2015-08-27 DIAGNOSIS — I10 Essential (primary) hypertension: Secondary | ICD-10-CM

## 2015-08-27 MED ORDER — BUSPIRONE HCL 5 MG PO TABS: 5.0000 mg | ORAL_TABLET | Freq: Two times a day (BID) | ORAL | Status: DC

## 2015-08-27 MED ORDER — LOSARTAN POTASSIUM-HCTZ 50-12.5 MG PO TABS: 1.0000 | ORAL_TABLET | Freq: Every day | ORAL | Status: DC

## 2015-08-27 MED ORDER — DIAZEPAM 2 MG PO TABS: 2.0000 mg | ORAL_TABLET | Freq: Every day | ORAL | Status: DC

## 2015-08-27 NOTE — Assessment & Plan Note (Signed)
Not under good control. Will increase losartan-hctz and recheck next visit. She will monitor at home and will call if BP too low or too high.

## 2015-08-27 NOTE — Progress Notes (Signed)
BP 163/69 mmHg  Pulse 80  Temp(Src) 98 F (36.7 C)  Wt 156 lb (70.761 kg)  SpO2 98%   Subjective:    Patient ID: Martha Singleton, female    DOB: 1946-09-07, 69 y.o.   MRN: [id number]  HPI: Martha Singleton is a 69 y.o. female  Chief Complaint  Patient presents with  . Anxiety    Patient states that she could not tolerate the Effexor, it cause a headache and nausea.   ANXIETY/STRESS, has been on prozac, zoloft, citalopram, effexor- bad reactions to all of them Couldn't take the effexor because it made her have a bad headache,  Duration:better Anxious mood: yes  Excessive worrying: yes Irritability: no  Sweating: no Nausea: no Palpitations:yes Hyperventilation: no Panic attacks: no Agoraphobia: no  Obscessions/compulsions: yes Depressed mood: no GAD 7 : Generalized Anxiety Score 08/27/2015  Nervous, Anxious, on Edge 1  Control/stop worrying 0  Worry too much - different things 1  Trouble relaxing 3  Restless 0  Easily annoyed or irritable 1  Afraid - awful might happen 1  Total GAD 7 Score 7  Anxiety Difficulty Somewhat difficult   Anhedonia: no Weight changes: no Insomnia: yes   Hypersomnia: no Fatigue/loss of energy: yes Feelings of worthlessness: no Feelings of guilt: no Impaired concentration/indecisiveness: no Suicidal ideations: no  Crying spells: no Recent Stressors/Life Changes: no   Relationship problems: no   Family stress: no     Financial stress: no    Job stress: no    Recent death/loss: yes  Doxycylcine cleared up her aches and pains  HYPERTENSION Hypertension status: uncontrolled  Satisfied with current treatment? no Duration of hypertension: chronic BP monitoring frequency:  weekly BP range: over 150 BP medication side effects:  no Medication compliance: good compliance Aspirin: no Recurrent headaches: no Visual changes: no Palpitations: yes Dyspnea: no Chest pain: no Lower extremity edema: no Dizzy/lightheaded: no   Relevant  past medical, surgical, family and social history reviewed and updated as indicated. Interim medical history since our last visit reviewed. Allergies and medications reviewed and updated.  Review of Systems  Constitutional: Negative.   Respiratory: Negative.   Cardiovascular: Negative.   Musculoskeletal: Negative.   Psychiatric/Behavioral: Negative.     Per HPI unless specifically indicated above     Objective:    BP 163/69 mmHg  Pulse 80  Temp(Src) 98 F (36.7 C)  Wt 156 lb (70.761 kg)  SpO2 98%  Wt Readings from Last 3 Encounters:  08/27/15 156 lb (70.761 kg)  07/09/15 150 lb (68.04 kg)  06/01/15 148 lb 8 oz (67.359 kg)    Physical Exam  Constitutional: She is oriented to person, place, and time. She appears well-developed and well-nourished. No distress.  HENT:  Head: Normocephalic and atraumatic.  Right Ear: Hearing normal.  Left Ear: Hearing normal.  Nose: Nose normal.  Eyes: Conjunctivae and lids are normal. Right eye exhibits no discharge. Left eye exhibits no discharge. No scleral icterus.  Cardiovascular: Normal rate and intact distal pulses.  An irregularly irregular rhythm present. Exam reveals no gallop and no friction rub.   Murmur heard. Pulmonary/Chest: Effort normal and breath sounds normal. No respiratory distress. She has no wheezes. She has no rales. She exhibits no tenderness.  Musculoskeletal: Normal range of motion.  Neurological: She is alert and oriented to person, place, and time.  Skin: Skin is warm, dry and intact. No rash noted. No erythema. No pallor.  Psychiatric: She has a normal mood and affect. Her  speech is normal and behavior is normal. Judgment and thought content normal. Cognition and memory are normal.    Results for orders placed or performed in visit on 07/09/15  CBC With Differential/Platelet  Result Value Ref Range   WBC 8.4 3.4 - 10.8 x10E3/uL   RBC 4.58 3.77 - 5.28 x10E6/uL   Hemoglobin 13.9 11.1 - 15.9 g/dL   Hematocrit  40.5 34.0 - 46.6 %   MCV 88 79 - 97 fL   MCH 30.3 26.6 - 33.0 pg   MCHC 34.3 31.5 - 35.7 g/dL   RDW 13.7 12.3 - 15.4 %   Platelets 295 150 - 379 x10E3/uL   Neutrophils 59 %   Lymphs 33 %   MID 8 %   Neutrophils Absolute 5.0 1.4 - 7.0 x10E3/uL   Lymphocytes Absolute 2.7 0.7 - 3.1 x10E3/uL   MID (Absolute) 0.7 0.1 - 1.6 X10E3/uL  Comprehensive metabolic panel  Result Value Ref Range   Glucose 98 65 - 99 mg/dL   BUN 18 8 - 27 mg/dL   Creatinine, Ser 0.86 0.57 - 1.00 mg/dL   GFR calc non Af Amer 69 >59 mL/min/1.73   GFR calc Af Amer 80 >59 mL/min/1.73   BUN/Creatinine Ratio 21 11 - 26   Sodium 142 134 - 144 mmol/L   Potassium 4.1 3.5 - 5.2 mmol/L   Chloride 100 97 - 108 mmol/L   CO2 25 18 - 29 mmol/L   Calcium 9.7 8.7 - 10.3 mg/dL   Total Protein 7.0 6.0 - 8.5 g/dL   Albumin 4.3 3.6 - 4.8 g/dL   Globulin, Total 2.7 1.5 - 4.5 g/dL   Albumin/Globulin Ratio 1.6 1.1 - 2.5   Bilirubin Total 0.9 0.0 - 1.2 mg/dL   Alkaline Phosphatase 82 39 - 117 IU/L   AST 17 0 - 40 IU/L   ALT 19 0 - 32 IU/L  TSH  Result Value Ref Range   TSH 1.470 0.450 - 4.500 uIU/mL  Lyme Ab/Western Blot Reflex  Result Value Ref Range   Lyme IgG/IgM Ab <0.91 0.00 - 0.90 ISR   LYME DISEASE AB, QUANT, IGM <0.80 0.00 - 0.79 index  Rocky mtn spotted fvr abs pnl(IgG+IgM)  Result Value Ref Range   RMSF IgG Negative Negative   RMSF IgM 1.05 (H) 0.00 - 0.89 index  Babesia microti Antibody Panel  Result Value Ref Range   Babesia microti IgM <1:10 Neg:<1:10   Babesia microti IgG <4:70 JGG:<8:36  Ehrlichia Antibody Panel  Result Value Ref Range   E.Chaffeensis (HME) IgG Negative Neg:<1:64   E. Chaffeensis (HME) IgM Titer Negative Neg:<1:20   HGE IgG Titer Negative Neg:<1:64   HGE IgM Titer Negative Neg:<1:20      Assessment & Plan:   Problem List Items Addressed This Visit      Cardiovascular and Mediastinum   Essential hypertension    Not under good control. Will increase losartan-hctz and recheck next  visit. She will monitor at home and will call if BP too low or too high.      Relevant Medications   losartan-hydrochlorothiazide (HYZAAR) 50-12.5 MG tablet     Other   Generalized anxiety disorder - Primary    Not under great control. Bad reactions to all her SSRIs/SNRIs in the past. Will try buspar to see if this can help. If still having issues and having bad reactions, will need to see psychiatry.        Other Visit Diagnoses    Immunization due  Flu shot given today.     Relevant Orders    Flu Vaccine QUAD 36+ mos PF IM (Fluarix & Fluzone Quad PF) (Completed)        Follow up plan: Return in about 4 weeks (around 09/24/2015).

## 2015-08-27 NOTE — Assessment & Plan Note (Signed)
Not under great control. Bad reactions to all her SSRIs/SNRIs in the past. Will try buspar to see if this can help. If still having issues and having bad reactions, will need to see psychiatry.

## 2015-08-27 NOTE — Patient Instructions (Signed)
Buspirone tablets What is this medicine? BUSPIRONE (byoo SPYE rone) is used to treat anxiety disorders. This medicine may be used for other purposes; ask your health care provider or pharmacist if you have questions. What should I tell my health care provider before I take this medicine? They need to know if you have any of these conditions: -kidney or liver disease -an unusual or allergic reaction to buspirone, other medicines, foods, dyes, or preservatives -pregnant or trying to get pregnant -breast-feeding How should I use this medicine? Take this medicine by mouth with a glass of water. Follow the directions on the prescription label. You may take this medicine with or without food. To ensure that this medicine always works the same way for you, you should take it either always with or always without food. Take your doses at regular intervals. Do not take your medicine more often than directed. Do not stop taking except on the advice of your doctor or health care professional. Talk to your pediatrician regarding the use of this medicine in children. Special care may be needed. Overdosage: If you think you have taken too much of this medicine contact a poison control center or emergency room at once. NOTE: This medicine is only for you. Do not share this medicine with others. What if I miss a dose? If you miss a dose, take it as soon as you can. If it is almost time for your next dose, take only that dose. Do not take double or extra doses. What may interact with this medicine? Do not take this medicine with any of the following medications: -linezolid -MAOIs like Carbex, Eldepryl, Marplan, Nardil, and Parnate -methylene blue -procarbazine This medicine may also interact with the following medications: -diazepam -digoxin -diltiazem -erythromycin -grapefruit juice -haloperidol -medicines for mental depression or mood problems -medicines for seizures like carbamazepine, phenobarbital  and phenytoin -nefazodone -other medications for anxiety -rifampin -ritonavir -some antifungal medicines like itraconazole, ketoconazole, and voriconazole -verapamil -warfarin This list may not describe all possible interactions. Give your health care provider a list of all the medicines, herbs, non-prescription drugs, or dietary supplements you use. Also tell them if you smoke, drink alcohol, or use illegal drugs. Some items may interact with your medicine. What should I watch for while using this medicine? Visit your doctor or health care professional for regular checks on your progress. It may take 1 to 2 weeks before your anxiety gets better. You may get drowsy or dizzy. Do not drive, use machinery, or do anything that needs mental alertness until you know how this drug affects you. Do not stand or sit up quickly, especially if you are an older patient. This reduces the risk of dizzy or fainting spells. Alcohol can make you more drowsy and dizzy. Avoid alcoholic drinks. What side effects may I notice from receiving this medicine? Side effects that you should report to your doctor or health care professional as soon as possible: -blurred vision or other vision changes -chest pain -confusion -difficulty breathing -feelings of hostility or anger -muscle aches and pains -numbness or tingling in hands or feet -ringing in the ears -skin rash and itching -vomiting -weakness Side effects that usually do not require medical attention (report to your doctor or health care professional if they continue or are bothersome): -disturbed dreams, nightmares -headache -nausea -restlessness or nervousness -sore throat and nasal congestion -stomach upset This list may not describe all possible side effects. Call your doctor for medical advice about side effects. You may report   side effects to FDA at 1-800-FDA-1088. Where should I keep my medicine? Keep out of the reach of children. Store at room  temperature below 30 degrees C (86 degrees F). Protect from light. Keep container tightly closed. Throw away any unused medicine after the expiration date. NOTE: This sheet is a summary. It may not cover all possible information. If you have questions about this medicine, talk to your doctor, pharmacist, or health care provider.    2016, Elsevier/Gold Standard. (2010-05-20 18:06:11)  

## 2015-09-11 DIAGNOSIS — H40053 Ocular hypertension, bilateral: Secondary | ICD-10-CM | POA: Diagnosis not present

## 2015-09-11 DIAGNOSIS — M3501 Sicca syndrome with keratoconjunctivitis: Secondary | ICD-10-CM | POA: Diagnosis not present

## 2015-09-22 ENCOUNTER — Ambulatory Visit: Payer: Self-pay | Admitting: Unknown Physician Specialty

## 2015-09-25 ENCOUNTER — Other Ambulatory Visit: Payer: Self-pay | Admitting: Family Medicine

## 2015-09-25 ENCOUNTER — Other Ambulatory Visit: Payer: Self-pay | Admitting: Unknown Physician Specialty

## 2015-09-28 NOTE — Telephone Encounter (Signed)
I think you are seeing her now.  Thanks

## 2015-09-28 NOTE — Telephone Encounter (Signed)
Needs an appointment.

## 2015-09-30 NOTE — Telephone Encounter (Signed)
Can we please get her scheduled for an appointment?

## 2015-10-01 NOTE — Telephone Encounter (Signed)
Pt has appt scheduled for 12/29 and would like to know if she can have refills until appt.

## 2015-10-01 NOTE — Telephone Encounter (Signed)
Rx sent to her pharmacy. OK for you to call in her diazepam. Thanks!

## 2015-10-01 NOTE — Telephone Encounter (Signed)
Patient is scheduled for an appointment can her meds be refilled?

## 2015-10-22 ENCOUNTER — Encounter: Payer: Self-pay | Admitting: Family Medicine

## 2015-10-22 ENCOUNTER — Ambulatory Visit (INDEPENDENT_AMBULATORY_CARE_PROVIDER_SITE_OTHER): Payer: Commercial Managed Care - HMO | Admitting: Family Medicine

## 2015-10-22 VITALS — BP 160/74 | HR 75 | Temp 98.0°F | Ht 58.6 in | Wt 159.0 lb

## 2015-10-22 DIAGNOSIS — I10 Essential (primary) hypertension: Secondary | ICD-10-CM | POA: Diagnosis not present

## 2015-10-22 DIAGNOSIS — R001 Bradycardia, unspecified: Secondary | ICD-10-CM | POA: Diagnosis not present

## 2015-10-22 DIAGNOSIS — Z13820 Encounter for screening for osteoporosis: Secondary | ICD-10-CM | POA: Diagnosis not present

## 2015-10-22 DIAGNOSIS — Z1231 Encounter for screening mammogram for malignant neoplasm of breast: Secondary | ICD-10-CM | POA: Diagnosis not present

## 2015-10-22 DIAGNOSIS — Z1211 Encounter for screening for malignant neoplasm of colon: Secondary | ICD-10-CM

## 2015-10-22 DIAGNOSIS — R32 Unspecified urinary incontinence: Secondary | ICD-10-CM

## 2015-10-22 DIAGNOSIS — N393 Stress incontinence (female) (male): Secondary | ICD-10-CM

## 2015-10-22 DIAGNOSIS — F411 Generalized anxiety disorder: Secondary | ICD-10-CM

## 2015-10-22 MED ORDER — LOSARTAN POTASSIUM-HCTZ 50-12.5 MG PO TABS: 2.0000 | ORAL_TABLET | Freq: Every day | ORAL | Status: DC

## 2015-10-22 MED ORDER — VENLAFAXINE HCL 25 MG PO TABS: 12.5000 mg | ORAL_TABLET | Freq: Every day | ORAL | Status: DC

## 2015-10-22 MED ORDER — DIAZEPAM 2 MG PO TABS: 2.0000 mg | ORAL_TABLET | Freq: Every day | ORAL | Status: DC

## 2015-10-22 MED ORDER — METOPROLOL SUCCINATE ER 25 MG PO TB24: 12.5000 mg | ORAL_TABLET | Freq: Every day | ORAL | Status: DC

## 2015-10-22 NOTE — Progress Notes (Addendum)
BP 160/74 mmHg  Pulse 75  Temp(Src) 98 F (36.7 C)  Ht 4' 10.6" (1.488 m)  Wt 159 lb (72.122 kg)  BMI 32.57 kg/m2  SpO2 99%   Subjective:    Patient ID: Martha Singleton, female    DOB: 1946/08/21, 69 y.o.   MRN: [id number]  HPI: Martha Singleton is a 69 y.o. female  Chief Complaint  Patient presents with  . Hypertension  . Anxiety  . Referral    needs a referral for dr.kowalski [place]- follow up for four months- low heart rate   . Referral    needs to have her bladder sling checked it has been 5 years already   .HYPERTENSION- pressure in her eyes has been high. BP has been high, has been running high, notes that she doesn't feel as much anxiety with taking the extra losartan Hypertension status: uncontrolled  Satisfied with current treatment? no Duration of hypertension: chronic BP monitoring frequency:  a few times a week BP range: [id number] systolic, didn't bring paper in today BP medication side effects:  no Medication compliance: excellent compliance Aspirin: yes Recurrent headaches: no Visual changes: no Palpitations: no Dyspnea: no Chest pain: no Lower extremity edema: no Dizzy/lightheaded: no  ANXIETY/STRESS- tried the buspar, felt like her heart starting fluttering again.  Duration:uncontrolled Anxious mood: yes  Excessive worrying: yes Irritability: no  Sweating:  no Nausea: no Palpitations:yes Hyperventilation: no Panic attacks: no Agoraphobia: no  Obscessions/compulsions: no Depressed mood: yes Anhedonia: no Weight changes: yes Insomnia: no   Hypersomnia: yes Fatigue/loss of energy: yes Feelings of worthlessness: no Feelings of guilt: no Impaired concentration/indecisiveness: no Suicidal ideations: no  Crying spells: no Recent Stressors/Life Changes: yes   Relationship problems: no   Family stress: no     Financial stress: yes    Job stress: yes    Recent death/loss: no GAD 7 : Generalized Anxiety Score 10/25/2015 08/27/2015  Nervous, Anxious,  on Edge 1 1  Control/stop worrying 1 0  Worry too much - different things 1 1  Trouble relaxing 1 3  Restless 0 0  Easily annoyed or irritable 1 1  Afraid - awful might happen 1 1  Total GAD 7 Score 6 7  Anxiety Difficulty Somewhat difficult Somewhat difficult   Depression screen PHQ 2/9 10/25/2015  Decreased Interest 1  Down, Depressed, Hopeless 1  PHQ - 2 Score 2  Altered sleeping 1  Tired, decreased energy 2  Change in appetite 2  Feeling bad or failure about yourself  1  Trouble concentrating 2  Moving slowly or fidgety/restless 0  Suicidal thoughts 0  PHQ-9 Score 10    Needs to have bladder sling checked. Had it about 15 years ago, doing well, has been noticing a lot of increased incontinence and wants to get it checked, last checked 5 years ago. Happens without her realizing, not stress or urge incontience, just going without any control.  Due to see [name] in January, needs a new referral just for routine maintenance of her appointments.   Relevant past medical, surgical, family and social history reviewed and updated as indicated. Interim medical history since our last visit reviewed. Allergies and medications reviewed and updated.  Review of Systems  Constitutional: Negative.   Respiratory: Negative.   Cardiovascular: Negative.   Musculoskeletal: Negative.   Psychiatric/Behavioral: Negative.     Per HPI unless specifically indicated above     Objective:    BP 160/74 mmHg  Pulse 75  Temp(Src) 98 F (  36.7 C)  Ht 4' 10.6" (1.488 m)  Wt 159 lb (72.122 kg)  BMI 32.57 kg/m2  SpO2 99%  Wt Readings from Last 3 Encounters:  10/22/15 159 lb (72.122 kg)  08/27/15 156 lb (70.761 kg)  07/09/15 150 lb (68.04 kg)    Physical Exam  Constitutional: She is oriented to person, place, and time. She appears well-developed and well-nourished. No distress.  HENT:  Head: Normocephalic and atraumatic.  Right Ear: Hearing and external ear normal.  Left Ear: Hearing  and external ear normal.  Nose: Nose normal.  Mouth/Throat: Oropharynx is clear and moist. No oropharyngeal exudate.  Eyes: Conjunctivae and lids are normal. Right eye exhibits no discharge. Left eye exhibits no discharge. No scleral icterus.  Neck: Normal range of motion. Neck supple. No JVD present. No tracheal deviation present. No thyromegaly present.  Cardiovascular: Normal rate, regular rhythm, normal heart sounds and intact distal pulses.  Exam reveals no gallop and no friction rub.   No murmur heard. Pulmonary/Chest: Effort normal and breath sounds normal. No stridor. No respiratory distress. She has no wheezes. She has no rales. She exhibits no tenderness.  Musculoskeletal: Normal range of motion.  Lymphadenopathy:    She has no cervical adenopathy.  Neurological: She is alert and oriented to person, place, and time.  Skin: Skin is warm, dry and intact. No rash noted. She is not diaphoretic. No erythema. No pallor.  Psychiatric: She has a normal mood and affect. Her speech is normal and behavior is normal. Judgment and thought content normal. Cognition and memory are normal.  Nursing note and vitals reviewed.   Results for orders placed or performed in visit on 07/09/15  CBC With Differential/Platelet  Result Value Ref Range   WBC 8.4 3.4 - 10.8 x10E3/uL   RBC 4.58 3.77 - 5.28 x10E6/uL   Hemoglobin 13.9 11.1 - 15.9 g/dL   Hematocrit 40.5 34.0 - 46.6 %   MCV 88 79 - 97 fL   MCH 30.3 26.6 - 33.0 pg   MCHC 34.3 31.5 - 35.7 g/dL   RDW 13.7 12.3 - 15.4 %   Platelets 295 150 - 379 x10E3/uL   Neutrophils 59 %   Lymphs 33 %   MID 8 %   Neutrophils Absolute 5.0 1.4 - 7.0 x10E3/uL   Lymphocytes Absolute 2.7 0.7 - 3.1 x10E3/uL   MID (Absolute) 0.7 0.1 - 1.6 X10E3/uL  Comprehensive metabolic panel  Result Value Ref Range   Glucose 98 65 - 99 mg/dL   BUN 18 8 - 27 mg/dL   Creatinine, Ser 0.86 0.57 - 1.00 mg/dL   GFR calc non Af Amer 69 >59 mL/min/1.73   GFR calc Af Amer 80 >59  mL/min/1.73   BUN/Creatinine Ratio 21 11 - 26   Sodium 142 134 - 144 mmol/L   Potassium 4.1 3.5 - 5.2 mmol/L   Chloride 100 97 - 108 mmol/L   CO2 25 18 - 29 mmol/L   Calcium 9.7 8.7 - 10.3 mg/dL   Total Protein 7.0 6.0 - 8.5 g/dL   Albumin 4.3 3.6 - 4.8 g/dL   Globulin, Total 2.7 1.5 - 4.5 g/dL   Albumin/Globulin Ratio 1.6 1.1 - 2.5   Bilirubin Total 0.9 0.0 - 1.2 mg/dL   Alkaline Phosphatase 82 39 - 117 IU/L   AST 17 0 - 40 IU/L   ALT 19 0 - 32 IU/L  TSH  Result Value Ref Range   TSH 1.470 0.450 - 4.500 uIU/mL  Lyme Ab/Western Blot Reflex  Result Value Ref Range   Lyme IgG/IgM Ab <0.91 0.00 - 0.90 ISR   LYME DISEASE AB, QUANT, IGM <0.80 0.00 - 0.79 index  Rocky mtn spotted fvr abs pnl(IgG+IgM)  Result Value Ref Range   RMSF IgG Negative Negative   RMSF IgM 1.05 (H) 0.00 - 0.89 index  Babesia microti Antibody Panel  Result Value Ref Range   Babesia microti IgM <1:10 Neg:<1:10   Babesia microti IgG [id number]  Ehrlichia Antibody Panel  Result Value Ref Range   E.Chaffeensis (HME) IgG Negative Neg:<1:64   E. Chaffeensis (HME) IgM Titer Negative Neg:<1:20   HGE IgG Titer Negative Neg:<1:64   HGE IgM Titer Negative Neg:<1:20      Assessment & Plan:   Problem List Items Addressed This Visit      Cardiovascular and Mediastinum   Essential hypertension    Not under good control. Will restart metoprolol at 12.5mg , close follow up to ensure she doesn't go bradycardic again. To see cardiology shortly, referral generated. Continue current regimen for this time.       Relevant Medications   metoprolol succinate (TOPROL-XL) 25 MG 24 hr tablet   losartan-hydrochlorothiazide (HYZAAR) 50-12.5 MG tablet     Other   Generalized anxiety disorder - Primary    Patient not doing well again. Would like to go back on the effexor. Will start a very low dose and increase very slowly. Refill of valium given today. Will increase effexor as needed and tolerated. Will follow up in 1 month  to see how she is doing.       Bradycardia    To see cardiology. Referral generated today. Await follow up.       Relevant Orders   Ambulatory referral to Cardiology    Other Visit Diagnoses    Encounter for screening mammogram for breast cancer        Mammogram ordered today.    Relevant Orders    MM DIGITAL SCREENING BILATERAL    Encounter for screening for osteoporosis        DEXA ordered today.    Relevant Orders    HM DEXA SCAN (Completed)    Screening for colon cancer        Colonoscopy ordered today.    Relevant Orders    Ambulatory referral to General Surgery    Incontinence in female        Needs bladder sling checked. Referral generated today.     Relevant Orders    Ambulatory referral to Gynecology        Follow up plan: Return in about 4 weeks (around 11/19/2015).

## 2015-10-25 NOTE — Assessment & Plan Note (Signed)
To see cardiology. Referral generated today. Await follow up.

## 2015-10-25 NOTE — Addendum Note (Signed)
Addended by: Valerie Roys on: 10/25/2015 02:26 PM   Modules accepted: Miquel Dunn

## 2015-10-25 NOTE — Assessment & Plan Note (Addendum)
Not under good control. Will restart metoprolol at 12.5mg , close follow up to ensure she doesn't go bradycardic again. To see cardiology shortly, referral generated. Continue current regimen for this time.

## 2015-10-25 NOTE — Assessment & Plan Note (Signed)
Patient not doing well again. Would like to go back on the effexor. Will start a very low dose and increase very slowly. Refill of valium given today. Will increase effexor as needed and tolerated. Will follow up in 1 month to see how she is doing.

## 2015-10-29 ENCOUNTER — Other Ambulatory Visit: Payer: Self-pay | Admitting: Family Medicine

## 2015-10-29 DIAGNOSIS — Z13820 Encounter for screening for osteoporosis: Secondary | ICD-10-CM

## 2015-11-19 ENCOUNTER — Ambulatory Visit: Payer: Commercial Managed Care - HMO | Admitting: Family Medicine

## 2015-11-25 ENCOUNTER — Other Ambulatory Visit: Payer: Self-pay

## 2015-11-25 ENCOUNTER — Ambulatory Visit: Payer: Commercial Managed Care - HMO

## 2015-12-02 ENCOUNTER — Ambulatory Visit
Admission: RE | Admit: 2015-12-02 | Discharge: 2015-12-02 | Disposition: A | Discharge status: Home / Self Care | Payer: Commercial Managed Care - HMO | Source: Ambulatory Visit | Attending: Family Medicine | Admitting: Family Medicine

## 2015-12-02 DIAGNOSIS — Z1231 Encounter for screening mammogram for malignant neoplasm of breast: Secondary | ICD-10-CM

## 2015-12-02 DIAGNOSIS — M85851 Other specified disorders of bone density and structure, right thigh: Secondary | ICD-10-CM | POA: Insufficient documentation

## 2015-12-02 DIAGNOSIS — Z13820 Encounter for screening for osteoporosis: Secondary | ICD-10-CM | POA: Diagnosis present

## 2015-12-02 DIAGNOSIS — Z780 Asymptomatic menopausal state: Secondary | ICD-10-CM | POA: Diagnosis not present

## 2015-12-03 ENCOUNTER — Encounter: Payer: Self-pay | Admitting: Family Medicine

## 2015-12-08 ENCOUNTER — Encounter: Payer: Self-pay | Admitting: Family Medicine

## 2015-12-14 ENCOUNTER — Ambulatory Visit (INDEPENDENT_AMBULATORY_CARE_PROVIDER_SITE_OTHER): Payer: Commercial Managed Care - HMO | Admitting: Family Medicine

## 2015-12-14 ENCOUNTER — Telehealth: Payer: Self-pay | Admitting: Family Medicine

## 2015-12-14 ENCOUNTER — Encounter: Payer: Self-pay | Admitting: Family Medicine

## 2015-12-14 ENCOUNTER — Telehealth: Payer: Self-pay

## 2015-12-14 VITALS — BP 157/70 | HR 72 | Temp 97.6°F | Ht 58.6 in | Wt 162.0 lb

## 2015-12-14 DIAGNOSIS — I10 Essential (primary) hypertension: Secondary | ICD-10-CM

## 2015-12-14 DIAGNOSIS — N3001 Acute cystitis with hematuria: Secondary | ICD-10-CM | POA: Diagnosis not present

## 2015-12-14 DIAGNOSIS — R300 Dysuria: Secondary | ICD-10-CM | POA: Diagnosis not present

## 2015-12-14 LAB — UA/M W/RFLX CULTURE, ROUTINE
BILIRUBIN UA: NEGATIVE
GLUCOSE, UA: NEGATIVE
KETONES UA: NEGATIVE
Nitrite, UA: NEGATIVE
PH UA: 6 (ref 5.0–7.5)
SPEC GRAV UA: 1.025 (ref 1.005–1.030)
UUROB: 0.2 mg/dL (ref 0.2–1.0)

## 2015-12-14 MED ORDER — CIPROFLOXACIN HCL 500 MG PO TABS: 500.0000 mg | ORAL_TABLET | Freq: Two times a day (BID) | ORAL | Status: DC

## 2015-12-14 NOTE — Telephone Encounter (Signed)
Pt came by needs refill on Diazepam. Please call when RX is ready for pick up. Thanks.

## 2015-12-14 NOTE — Telephone Encounter (Signed)
Patient is requesting a refill on her Diazepam. I let patient know that it may be this afternoon or even tomorrow.

## 2015-12-14 NOTE — Progress Notes (Signed)
BP 157/70 mmHg  Pulse 72  Temp(Src) 97.6 F (36.4 C)  Ht 4' 10.6" (1.488 m)  Wt 162 lb (73.483 kg)  BMI 33.19 kg/m2  SpO2 96%   Subjective:    Patient ID: Martha Singleton, female    DOB: 1945-11-04, 70 y.o.   MRN: [id number]  HPI: Martha Singleton is a 70 y.o. female  Chief Complaint  Patient presents with  . Urinary Tract Infection    Patient is in severe paim, she is nable to produce a urine sample. The pain started yesterday. Low pelvic pain and back pain   URINARY SYMPTOMS Duration: 1 day Dysuria: yes Urinary frequency: no Urgency: yes Small volume voids: yes Symptom severity: severe Urinary incontinence: yes Foul odor: no Hematuria: no Abdominal pain: yes Back pain: yes Suprapubic pain/pressure: yes Flank pain: no Fever:  no Vomiting: no Relief with cranberry juice: no Relief with pyridium: no Status: worse Previous urinary tract infection: yes Recurrent urinary tract infection: yes Sexual activity: No sexually active  Relevant past medical, surgical, family and social history reviewed and updated as indicated. Interim medical history since our last visit reviewed. Allergies and medications reviewed and updated.  Review of Systems  Constitutional: Negative.   Respiratory: Negative.   Cardiovascular: Negative.   Gastrointestinal: Positive for nausea and abdominal pain. Negative for vomiting, diarrhea, constipation, blood in stool, abdominal distention, anal bleeding and rectal pain.  Genitourinary: Positive for dysuria, decreased urine volume and difficulty urinating. Negative for urgency, frequency, hematuria, flank pain, vaginal bleeding, vaginal discharge, enuresis, genital sores, vaginal pain, menstrual problem, pelvic pain and dyspareunia.  Psychiatric/Behavioral: Negative.     Per HPI unless specifically indicated above     Objective:    BP 157/70 mmHg  Pulse 72  Temp(Src) 97.6 F (36.4 C)  Ht 4' 10.6" (1.488 m)  Wt 162 lb (73.483 kg)  BMI 33.19  kg/m2  SpO2 96%  Wt Readings from Last 3 Encounters:  12/14/15 162 lb (73.483 kg)  10/22/15 159 lb (72.122 kg)  08/27/15 156 lb (70.761 kg)    Physical Exam  Constitutional: She is oriented to person, place, and time. She appears well-developed and well-nourished. She appears distressed.  HENT:  Head: Normocephalic and atraumatic.  Right Ear: Hearing normal.  Left Ear: Hearing normal.  Nose: Nose normal.  Eyes: Conjunctivae and lids are normal. Right eye exhibits no discharge. Left eye exhibits no discharge. No scleral icterus.  Cardiovascular: Normal rate, regular rhythm, normal heart sounds and intact distal pulses.  Exam reveals no gallop and no friction rub.   No murmur heard. Pulmonary/Chest: Effort normal and breath sounds normal. No respiratory distress. She has no wheezes. She has no rales. She exhibits no tenderness.  Abdominal: Soft. Bowel sounds are normal. She exhibits no distension and no mass. There is generalized tenderness. There is no rigidity, no rebound, no guarding, no CVA tenderness, no tenderness at McBurney's point and negative Murphy's sign.  Musculoskeletal: Normal range of motion.  Neurological: She is alert and oriented to person, place, and time.  Skin: Skin is warm, dry and intact. No rash noted. She is not diaphoretic. No erythema. No pallor.  Psychiatric: She has a normal mood and affect. Her speech is normal and behavior is normal. Judgment and thought content normal. Cognition and memory are normal.  Nursing note and vitals reviewed.   Results for orders placed or performed in visit on 07/09/15  CBC With Differential/Platelet  Result Value Ref Range   WBC 8.4 3.4 -  10.8 x10E3/uL   RBC 4.58 3.77 - 5.28 x10E6/uL   Hemoglobin 13.9 11.1 - 15.9 g/dL   Hematocrit 40.5 34.0 - 46.6 %   MCV 88 79 - 97 fL   MCH 30.3 26.6 - 33.0 pg   MCHC 34.3 31.5 - 35.7 g/dL   RDW 13.7 12.3 - 15.4 %   Platelets 295 150 - 379 x10E3/uL   Neutrophils 59 %   Lymphs 33 %    MID 8 %   Neutrophils Absolute 5.0 1.4 - 7.0 x10E3/uL   Lymphocytes Absolute 2.7 0.7 - 3.1 x10E3/uL   MID (Absolute) 0.7 0.1 - 1.6 X10E3/uL  Comprehensive metabolic panel  Result Value Ref Range   Glucose 98 65 - 99 mg/dL   BUN 18 8 - 27 mg/dL   Creatinine, Ser 0.86 0.57 - 1.00 mg/dL   GFR calc non Af Amer 69 >59 mL/min/1.73   GFR calc Af Amer 80 >59 mL/min/1.73   BUN/Creatinine Ratio 21 11 - 26   Sodium 142 134 - 144 mmol/L   Potassium 4.1 3.5 - 5.2 mmol/L   Chloride 100 97 - 108 mmol/L   CO2 25 18 - 29 mmol/L   Calcium 9.7 8.7 - 10.3 mg/dL   Total Protein 7.0 6.0 - 8.5 g/dL   Albumin 4.3 3.6 - 4.8 g/dL   Globulin, Total 2.7 1.5 - 4.5 g/dL   Albumin/Globulin Ratio 1.6 1.1 - 2.5   Bilirubin Total 0.9 0.0 - 1.2 mg/dL   Alkaline Phosphatase 82 39 - 117 IU/L   AST 17 0 - 40 IU/L   ALT 19 0 - 32 IU/L  TSH  Result Value Ref Range   TSH 1.470 0.450 - 4.500 uIU/mL  Lyme Ab/Western Blot Reflex  Result Value Ref Range   Lyme IgG/IgM Ab <0.91 0.00 - 0.90 ISR   LYME DISEASE AB, QUANT, IGM <0.80 0.00 - 0.79 index  Rocky mtn spotted fvr abs pnl(IgG+IgM)  Result Value Ref Range   RMSF IgG Negative Negative   RMSF IgM 1.05 (H) 0.00 - 0.89 index  Babesia microti Antibody Panel  Result Value Ref Range   Babesia microti IgM <1:10 Neg:<1:10   Babesia microti IgG [id number]  Ehrlichia Antibody Panel  Result Value Ref Range   E.Chaffeensis (HME) IgG Negative Neg:<1:64   E. Chaffeensis (HME) IgM Titer Negative Neg:<1:20   HGE IgG Titer Negative Neg:<1:64   HGE IgM Titer Negative Neg:<1:20      Assessment & Plan:   Problem List Items Addressed This Visit      Cardiovascular and Mediastinum   Essential hypertension    Better on recheck. Continue current regimen. No sign of bradycardia. She is concerned about adrenal issues and would like to know if her cortisol has ever been checked. We will look into this and see and if not, we will check.        Other Visit Diagnoses     Acute cystitis with hematuria    -  Primary    + UTI. Will treat with cipro. Call if not getting better or getting worse.     Dysuria        Will check urine. Await results.     Relevant Orders    UA/M w/rflx Culture, Routine        Follow up plan: Return ASAP, for Overdue for anxiety visit. Marland Kitchen

## 2015-12-14 NOTE — Telephone Encounter (Signed)
Christan: Please get patient scheduled with Dr.Johnson.

## 2015-12-14 NOTE — Telephone Encounter (Signed)
She is overdue for an appointment for her anxiety. Will need that scheduled so we can get her refill.

## 2015-12-14 NOTE — Assessment & Plan Note (Signed)
Better on recheck. Continue current regimen. No sign of bradycardia. She is concerned about adrenal issues and would like to know if her cortisol has ever been checked. We will look into this and see and if not, we will check.

## 2015-12-17 ENCOUNTER — Ambulatory Visit: Payer: Commercial Managed Care - HMO | Admitting: Family Medicine

## 2015-12-18 ENCOUNTER — Telehealth: Payer: Self-pay | Admitting: Family Medicine

## 2015-12-18 MED ORDER — DIAZEPAM 2 MG PO TABS: 2.0000 mg | ORAL_TABLET | Freq: Every day | ORAL | Status: DC

## 2015-12-18 NOTE — Telephone Encounter (Signed)
Routed to MJ 

## 2015-12-18 NOTE — Telephone Encounter (Signed)
Called into [facility] 

## 2015-12-18 NOTE — Telephone Encounter (Signed)
Not enough urine to do culture.

## 2015-12-18 NOTE — Telephone Encounter (Signed)
Pt would like a call back to discuss lab results from 12/14/15.

## 2015-12-18 NOTE — Telephone Encounter (Signed)
OK To call in her diazepam. She is aware that she will need an appointment to get any more of this medicine.

## 2015-12-23 ENCOUNTER — Encounter: Payer: Commercial Managed Care - HMO | Admitting: Obstetrics and Gynecology

## 2015-12-24 ENCOUNTER — Other Ambulatory Visit: Payer: Self-pay | Admitting: Family Medicine

## 2016-01-01 NOTE — Telephone Encounter (Signed)
Done

## 2016-01-11 ENCOUNTER — Ambulatory Visit: Payer: Commercial Managed Care - HMO | Admitting: Family Medicine

## 2016-01-12 ENCOUNTER — Encounter: Payer: Self-pay | Admitting: Family Medicine

## 2016-02-03 ENCOUNTER — Telehealth: Payer: Self-pay | Admitting: Family Medicine

## 2016-02-03 NOTE — Telephone Encounter (Signed)
She no-showed her appointment. She needs an appointment to get the Valium. She also had a 90 day supply of her atorvastatin sent in in March. She's not due until June.

## 2016-02-03 NOTE — Telephone Encounter (Signed)
I will call patient and let her know that she should have atorvastatin at [facility]. Is it time for her Valium?

## 2016-02-03 NOTE — Telephone Encounter (Signed)
Patient needs an appointment to be seen for Cholesterol and Anxiety.

## 2016-02-03 NOTE — Telephone Encounter (Signed)
Pt needs refill on atorvastatin sent to [facility] order and diazepam sent to [facility]

## 2016-02-03 NOTE — Telephone Encounter (Signed)
Called and left a message for patient to call the office to schedule an appointment to have further refills.

## 2016-02-04 ENCOUNTER — Other Ambulatory Visit: Payer: Self-pay | Admitting: Family Medicine

## 2016-02-05 ENCOUNTER — Encounter: Payer: Commercial Managed Care - HMO | Admitting: Family Medicine

## 2016-02-18 ENCOUNTER — Encounter: Payer: Self-pay | Admitting: Family Medicine

## 2016-02-18 ENCOUNTER — Ambulatory Visit (INDEPENDENT_AMBULATORY_CARE_PROVIDER_SITE_OTHER): Payer: Commercial Managed Care - HMO | Admitting: Family Medicine

## 2016-02-18 VITALS — BP 172/78 | HR 44 | Temp 97.8°F | Ht 58.7 in | Wt 167.0 lb

## 2016-02-18 DIAGNOSIS — E785 Hyperlipidemia, unspecified: Secondary | ICD-10-CM | POA: Diagnosis not present

## 2016-02-18 DIAGNOSIS — J309 Allergic rhinitis, unspecified: Secondary | ICD-10-CM | POA: Diagnosis not present

## 2016-02-18 DIAGNOSIS — R001 Bradycardia, unspecified: Secondary | ICD-10-CM

## 2016-02-18 DIAGNOSIS — E119 Type 2 diabetes mellitus without complications: Secondary | ICD-10-CM

## 2016-02-18 DIAGNOSIS — I10 Essential (primary) hypertension: Secondary | ICD-10-CM | POA: Diagnosis not present

## 2016-02-18 DIAGNOSIS — E669 Obesity, unspecified: Secondary | ICD-10-CM

## 2016-02-18 DIAGNOSIS — F411 Generalized anxiety disorder: Secondary | ICD-10-CM

## 2016-02-18 HISTORY — DX: Type 2 diabetes mellitus without complications: E11.9

## 2016-02-18 LAB — MICROSCOPIC EXAMINATION
RBC MICROSCOPIC, UA: NONE SEEN /HPF (ref 0–?)
WBC UA: NONE SEEN /HPF (ref 0–?)

## 2016-02-18 LAB — UA/M W/RFLX CULTURE, ROUTINE
BILIRUBIN UA: NEGATIVE
Glucose, UA: NEGATIVE
Ketones, UA: NEGATIVE
LEUKOCYTES UA: NEGATIVE
NITRITE UA: NEGATIVE
PH UA: 7 (ref 5.0–7.5)
RBC UA: NEGATIVE
Specific Gravity, UA: 1.01 (ref 1.005–1.030)
Urobilinogen, Ur: 0.2 mg/dL (ref 0.2–1.0)

## 2016-02-18 LAB — MICROALBUMIN, URINE WAIVED
Creatinine, Urine Waived: 50 mg/dL (ref 10–300)
MICROALB, UR WAIVED: 150 mg/L — AB (ref 0–19)
Microalb/Creat Ratio: >300 mg/g — ABNORMAL HIGH (ref <30)

## 2016-02-18 MED ORDER — VENLAFAXINE HCL 25 MG PO TABS: 25.0000 mg | ORAL_TABLET | Freq: Every day | ORAL | Status: DC

## 2016-02-18 MED ORDER — MOMETASONE FUROATE 50 MCG/ACT NA SUSP: 2.0000 | Freq: Every day | NASAL | Status: DC

## 2016-02-18 NOTE — Patient Instructions (Addendum)
Health Maintenance, Female Adopting a healthy lifestyle and getting preventive care can go a long way to promote health and wellness. Talk with your health care provider about what schedule of regular examinations is right for you. This is a good chance for you to check in with your provider about disease prevention and staying healthy. In between checkups, there are plenty of things you can do on your own. Experts have done a lot of research about which lifestyle changes and preventive measures are most likely to keep you healthy. Ask your health care provider for more information. WEIGHT AND DIET  Eat a healthy diet  Be sure to include plenty of vegetables, fruits, low-fat dairy products, and lean protein.  Do not eat a lot of foods high in solid fats, added sugars, or salt.  Get regular exercise. This is one of the most important things you can do for your health.  Most adults should exercise for at least 150 minutes each week. The exercise should increase your heart rate and make you sweat (moderate-intensity exercise).  Most adults should also do strengthening exercises at least twice a week. This is in addition to the moderate-intensity exercise.  Maintain a healthy weight  Body mass index (BMI) is a measurement that can be used to identify possible weight problems. It estimates body fat based on height and weight. Your health care provider can help determine your BMI and help you achieve or maintain a healthy weight.  For females 20 years of age and older:   A BMI below 18.5 is considered underweight.  A BMI of 18.5 to 24.9 is normal.  A BMI of 25 to 29.9 is considered overweight.  A BMI of 30 and above is considered obese.  Watch levels of cholesterol and blood lipids  You should start having your blood tested for lipids and cholesterol at 70 years of age, then have this test every 5 years.  You may need to have your cholesterol levels checked more often if:  Your lipid  or cholesterol levels are high.  You are older than 70 years of age.  You are at high risk for heart disease.  CANCER SCREENING   Lung Cancer  Lung cancer screening is recommended for adults 55-80 years old who are at high risk for lung cancer because of a history of smoking.  A yearly low-dose CT scan of the lungs is recommended for people who:  Currently smoke.  Have quit within the past 15 years.  Have at least a 30-pack-year history of smoking. A pack year is smoking an average of one pack of cigarettes a day for 1 year.  Yearly screening should continue until it has been 15 years since you quit.  Yearly screening should stop if you develop a health problem that would prevent you from having lung cancer treatment.  Breast Cancer  Practice breast self-awareness. This means understanding how your breasts normally appear and feel.  It also means doing regular breast self-exams. Let your health care provider know about any changes, no matter how small.  If you are in your 20s or 30s, you should have a clinical breast exam (CBE) by a health care provider every 1-3 years as part of a regular health exam.  If you are 40 or older, have a CBE every year. Also consider having a breast X-ray (mammogram) every year.  If you have a family history of breast cancer, talk to your health care provider about genetic screening.  If you   are at high risk for breast cancer, talk to your health care provider about having an MRI and a mammogram every year.  Breast cancer gene (BRCA) assessment is recommended for women who have family members with BRCA-related cancers. BRCA-related cancers include:  Breast.  Ovarian.  Tubal.  Peritoneal cancers.  Results of the assessment will determine the need for genetic counseling and BRCA1 and BRCA2 testing. Cervical Cancer Your health care provider may recommend that you be screened regularly for cancer of the pelvic organs (ovaries, uterus, and  vagina). This screening involves a pelvic examination, including checking for microscopic changes to the surface of your cervix (Pap test). You may be encouraged to have this screening done every 3 years, beginning at age 21.  For women ages 30-65, health care providers may recommend pelvic exams and Pap testing every 3 years, or they may recommend the Pap and pelvic exam, combined with testing for human papilloma virus (HPV), every 5 years. Some types of HPV increase your risk of cervical cancer. Testing for HPV may also be done on women of any age with unclear Pap test results.  Other health care providers may not recommend any screening for nonpregnant women who are considered low risk for pelvic cancer and who do not have symptoms. Ask your health care provider if a screening pelvic exam is right for you.  If you have had past treatment for cervical cancer or a condition that could lead to cancer, you need Pap tests and screening for cancer for at least 20 years after your treatment. If Pap tests have been discontinued, your risk factors (such as having a new sexual partner) need to be reassessed to determine if screening should resume. Some women have medical problems that increase the chance of getting cervical cancer. In these cases, your health care provider may recommend more frequent screening and Pap tests. Colorectal Cancer  This type of cancer can be detected and often prevented.  Routine colorectal cancer screening usually begins at 70 years of age and continues through 70 years of age.  Your health care provider may recommend screening at an earlier age if you have risk factors for colon cancer.  Your health care provider may also recommend using home test kits to check for hidden blood in the stool.  A small camera at the end of a tube can be used to examine your colon directly (sigmoidoscopy or colonoscopy). This is done to check for the earliest forms of colorectal  cancer.  Routine screening usually begins at age 50.  Direct examination of the colon should be repeated every 5-10 years through 70 years of age. However, you may need to be screened more often if early forms of precancerous polyps or small growths are found. Skin Cancer  Check your skin from head to toe regularly.  Tell your health care provider about any new moles or changes in moles, especially if there is a change in a mole's shape or color.  Also tell your health care provider if you have a mole that is larger than the size of a pencil eraser.  Always use sunscreen. Apply sunscreen liberally and repeatedly throughout the day.  Protect yourself by wearing long sleeves, pants, a wide-brimmed hat, and sunglasses whenever you are outside. HEART DISEASE, DIABETES, AND HIGH BLOOD PRESSURE   High blood pressure causes heart disease and increases the risk of stroke. High blood pressure is more likely to develop in:  People who have blood pressure in the high end   of the normal range (130-139/85-89 mm Hg).  People who are overweight or obese.  People who are African American.  If you are 38-23 years of age, have your blood pressure checked every 3-5 years. If you are 61 years of age or older, have your blood pressure checked every year. You should have your blood pressure measured twice--once when you are at a hospital or clinic, and once when you are not at a hospital or clinic. Record the average of the two measurements. To check your blood pressure when you are not at a hospital or clinic, you can use:  An automated blood pressure machine at a pharmacy.  A home blood pressure monitor.  If you are between 45 years and 39 years old, ask your health care provider if you should take aspirin to prevent strokes.  Have regular diabetes screenings. This involves taking a blood sample to check your fasting blood sugar level.  If you are at a normal weight and have a low risk for diabetes,  have this test once every three years after 70 years of age.  If you are overweight and have a high risk for diabetes, consider being tested at a younger age or more often. PREVENTING INFECTION  Hepatitis B  If you have a higher risk for hepatitis B, you should be screened for this virus. You are considered at high risk for hepatitis B if:  You were born in a country where hepatitis B is common. Ask your health care provider which countries are considered high risk.  Your parents were born in a high-risk country, and you have not been immunized against hepatitis B (hepatitis B vaccine).  You have HIV or AIDS.  You use needles to inject street drugs.  You live with someone who has hepatitis B.  You have had sex with someone who has hepatitis B.  You get hemodialysis treatment.  You take certain medicines for conditions, including cancer, organ transplantation, and autoimmune conditions. Hepatitis C  Blood testing is recommended for:  Everyone born from [birth year] through 1965.  Anyone with known risk factors for hepatitis C. Sexually transmitted infections (STIs)  You should be screened for sexually transmitted infections (STIs) including gonorrhea and chlamydia if:  You are sexually active and are younger than 70 years of age.  You are older than 70 years of age and your health care provider tells you that you are at risk for this type of infection.  Your sexual activity has changed since you were last screened and you are at an increased risk for chlamydia or gonorrhea. Ask your health care provider if you are at risk.  If you do not have HIV, but are at risk, it may be recommended that you take a prescription medicine daily to prevent HIV infection. This is called pre-exposure prophylaxis (PrEP). You are considered at risk if:  You are sexually active and do not regularly use condoms or know the HIV status of your partner(s).  You take drugs by injection.  You are sexually  active with a partner who has HIV. Talk with your health care provider about whether you are at high risk of being infected with HIV. If you choose to begin PrEP, you should first be tested for HIV. You should then be tested every 3 months for as long as you are taking PrEP.  PREGNANCY   If you are premenopausal and you may become pregnant, ask your health care provider about preconception counseling.  If you may  PrEP). You are considered at risk if:    You are sexually active and do not regularly use condoms or know the HIV status of your partner(s).    You take drugs by injection.    You are sexually  active with a partner who has HIV.  Talk with your health care provider about whether you are at high risk of being infected with HIV. If you choose to begin PrEP, you should first be tested for HIV. You should then be tested every 3 months for as long as you are taking PrEP.   PREGNANCY   · If you are premenopausal and you may become pregnant, ask your health care provider about preconception counseling.  · If you may become pregnant, take 400 to 800 micrograms (mcg) of folic acid every day.  · If you want to prevent pregnancy, talk to your health care provider about birth control (contraception).  OSTEOPOROSIS AND MENOPAUSE   · Osteoporosis is a disease in which the bones lose minerals and strength with aging. This can result in serious bone fractures. Your risk for osteoporosis can be identified using a bone density scan.  · If you are 65 years of age or older, or if you are at risk for osteoporosis and fractures, ask your health care provider if you should be screened.  · Ask your health care provider whether you should take a calcium or vitamin D supplement to lower your risk for osteoporosis.  · Menopause may have certain physical symptoms and risks.  · Hormone replacement therapy may reduce some of these symptoms and risks.  Talk to your health care provider about whether hormone replacement therapy is right for you.   HOME CARE INSTRUCTIONS   · Schedule regular health, dental, and eye exams.  · Stay current with your immunizations.    · Do not use any tobacco products including cigarettes, chewing tobacco, or electronic cigarettes.  · If you are pregnant, do not drink alcohol.  · If you are breastfeeding, limit how much and how often you drink alcohol.  · Limit alcohol intake to no more than 1 drink per day for nonpregnant women. One drink equals 12 ounces of beer, 5 ounces of wine, or 1½ ounces of hard liquor.  · Do not use street drugs.  · Do not share needles.  · Ask your health care provider for help if  you need support or information about quitting drugs.  · Tell your health care provider if you often feel depressed.  · Tell your health care provider if you have ever been abused or do not feel safe at home.     This information is not intended to replace advice given to you by your health care provider. Make sure you discuss any questions you have with your health care provider.     Document Released: 04/25/2011 Document Revised: 10/31/2014 Document Reviewed: 09/11/2013  Elsevier Interactive Patient Education ©2016 [employer]  Menopause is a normal process in which your reproductive ability comes to an end. This process happens gradually over a span of months to years, usually between the ages of 48 and 55. Menopause is complete when you have missed 12 consecutive menstrual periods.  It is important to talk with your health care provider about some of the most common conditions that affect postmenopausal women, such as heart disease, cancer, and bone loss (osteoporosis). Adopting a healthy lifestyle and getting preventive care can help to promote your health and wellness. Those actions can also   lower your chances of developing some of these common conditions.  WHAT SHOULD I KNOW ABOUT MENOPAUSE?  During menopause, you may experience a number of symptoms, such as:  · Moderate-to-severe hot flashes.  · Night sweats.  · Decrease in sex drive.  · Mood swings.  · Headaches.  · Tiredness.  · Irritability.  · Memory problems.  · Insomnia.  Choosing to treat or not to treat menopausal changes is an individual decision that you make with your health care provider.  WHAT SHOULD I KNOW ABOUT HORMONE REPLACEMENT THERAPY AND SUPPLEMENTS?  Hormone therapy products are effective for treating symptoms that are associated with menopause, such as hot flashes and night sweats. Hormone replacement carries certain risks, especially as you become older. If you are thinking about using estrogen or estrogen with progestin treatments,  discuss the benefits and risks with your health care provider.  WHAT SHOULD I KNOW ABOUT HEART DISEASE AND STROKE?  Heart disease, heart attack, and stroke become more likely as you age. This may be due, in part, to the hormonal changes that your body experiences during menopause. These can affect how your body processes dietary fats, triglycerides, and cholesterol. Heart attack and stroke are both medical emergencies.  There are many things that you can do to help prevent heart disease and stroke:  · Have your blood pressure checked at least every 1-2 years. High blood pressure causes heart disease and increases the risk of stroke.  · If you are 55-79 years old, ask your health care provider if you should take aspirin to prevent a heart attack or a stroke.  · Do not use any tobacco products, including cigarettes, chewing tobacco, or electronic cigarettes. If you need help quitting, ask your health care provider.  · It is important to eat a healthy diet and maintain a healthy weight.    Be sure to include plenty of vegetables, fruits, low-fat dairy products, and lean protein.    Avoid eating foods that are high in solid fats, added sugars, or salt (sodium).  · Get regular exercise. This is one of the most important things that you can do for your health.    Try to exercise for at least 150 minutes each week. The type of exercise that you do should increase your heart rate and make you sweat. This is known as moderate-intensity exercise.    Try to do strengthening exercises at least twice each week. Do these in addition to the moderate-intensity exercise.  · Know your numbers. Ask your health care provider to check your cholesterol and your blood glucose. Continue to have your blood tested as directed by your health care provider.  WHAT SHOULD I KNOW ABOUT CANCER SCREENING?  There are several types of cancer. Take the following steps to reduce your risk and to catch any cancer development as early as  possible.  Breast Cancer  · Practice breast self-awareness.    This means understanding how your breasts normally appear and feel.    It also means doing regular breast self-exams. Let your health care provider know about any changes, no matter how small.  · If you are 40 or older, have a clinician do a breast exam (clinical breast exam or CBE) every year. Depending on your age, family history, and medical history, it may be recommended that you also have a yearly breast X-ray (mammogram).  · If you have a family history of breast cancer, talk with your health care provider about genetic screening.  ·   If you are at high risk for breast cancer, talk with your health care provider about having an MRI and a mammogram every year.  · Breast cancer (BRCA) gene test is recommended for women who have family members with BRCA-related cancers. Results of the assessment will determine the need for genetic counseling and BRCA1 and for BRCA2 testing. BRCA-related cancers include these types:    Breast. This occurs in males or females.    Ovarian.    Tubal. This may also be called fallopian tube cancer.    Cancer of the abdominal or pelvic lining (peritoneal cancer).    Prostate.    Pancreatic.  Cervical, Uterine, and Ovarian Cancer  Your health care provider may recommend that you be screened regularly for cancer of the pelvic organs. These include your ovaries, uterus, and vagina. This screening involves a pelvic exam, which includes checking for microscopic changes to the surface of your cervix (Pap test).  · For women ages 21-65, health care providers may recommend a pelvic exam and a Pap test every three years. For women ages 30-65, they may recommend the Pap test and pelvic exam, combined with testing for human papilloma virus (HPV), every five years. Some types of HPV increase your risk of cervical cancer. Testing for HPV may also be done on women of any age who have unclear Pap test results.  · Other health care providers  may not recommend any screening for nonpregnant women who are considered low risk for pelvic cancer and have no symptoms. Ask your health care provider if a screening pelvic exam is right for you.  · If you have had past treatment for cervical cancer or a condition that could lead to cancer, you need Pap tests and screening for cancer for at least 20 years after your treatment. If Pap tests have been discontinued for you, your risk factors (such as having a new sexual partner) need to be reassessed to determine if you should start having screenings again. Some women have medical problems that increase the chance of getting cervical cancer. In these cases, your health care provider may recommend that you have screening and Pap tests more often.  · If you have a family history of uterine cancer or ovarian cancer, talk with your health care provider about genetic screening.  · If you have vaginal bleeding after reaching menopause, tell your health care provider.  · There are currently no reliable tests available to screen for ovarian cancer.  Lung Cancer  Lung cancer screening is recommended for adults 55-80 years old who are at high risk for lung cancer because of a history of smoking. A yearly low-dose CT scan of the lungs is recommended if you:  · Currently smoke.  · Have a history of at least 30 pack-years of smoking and you currently smoke or have quit within the past 15 years. A pack-year is smoking an average of one pack of cigarettes per day for one year.  Yearly screening should:  · Continue until it has been 15 years since you quit.  · Stop if you develop a health problem that would prevent you from having lung cancer treatment.  Colorectal Cancer  · This type of cancer can be detected and can often be prevented.  · Routine colorectal cancer screening usually begins at age 50 and continues through age 75.  · If you have risk factors for colon cancer, your health care provider may recommend that you be  screened at an earlier age.  ·   wearing long sleeves, pants, a wide-brimmed hat, and sunglasses. WHAT SHOULD I KNOW ABOUT OSTEOPOROSIS? Osteoporosis is a condition in which bone destruction happens more quickly than new bone creation. After menopause, you may be at an increased risk for osteoporosis. To help prevent osteoporosis or the bone fractures that can happen because of osteoporosis, the following is recommended:  If you are 60-36 years old, get at least 1,000 mg of calcium and at least 600 mg of vitamin D per day.  If you are older than age 21 but younger than age 63, get at least 1,200 mg of calcium and at least 600 mg of vitamin D per day.  If you are older than  age 65, get at least 1,200 mg of calcium and at least 800 mg of vitamin D per day. Smoking and excessive alcohol intake increase the risk of osteoporosis. Eat foods that are rich in calcium and vitamin D, and do weight-bearing exercises several times each week as directed by your health care provider. WHAT SHOULD I KNOW ABOUT HOW MENOPAUSE AFFECTS [place]? Depression may occur at any age, but it is more common as you become older. Common symptoms of depression include:  Low or sad mood.  Changes in sleep patterns.  Changes in appetite or eating patterns.  Feeling an overall lack of motivation or enjoyment of activities that you previously enjoyed.  Frequent crying spells. Talk with your health care provider if you think that you are experiencing depression. WHAT SHOULD I KNOW ABOUT IMMUNIZATIONS? It is important that you get and maintain your immunizations. These include:  Tetanus, diphtheria, and pertussis (Tdap) booster vaccine.  Influenza every year before the flu season begins.  Pneumonia vaccine.  Shingles vaccine. Your health care provider may also recommend other immunizations.   This information is not intended to replace advice given to you by your health care provider. Make sure you discuss any questions you have with your health care provider.   Document Released: 12/02/2005 Document Revised: 10/31/2014 Document Reviewed: 06/12/2014 Elsevier Interactive Patient Education 2016 [employer] Bradycardia Bradycardia is a slower-than-normal heart rate. A normal resting heart rate for an adult ranges from 60 to 100 beats per minute. With bradycardia, the resting heart rate is less than 60 beats per minute. Bradycardia is a problem if your heart cannot pump enough oxygen-rich blood through your body. Bradycardia is not a problem for everyone. For some healthy adults, a slow resting heart rate is normal.  CAUSES  Bradycardia may be caused by:  A problem with the  heart's electrical system, such as heart block.  A problem with the heart's natural pacemaker (sinus node).  Heart disease, damage, or infection.  Certain medicines that treat heart conditions.  Certain conditions, such as hypothyroidism and obstructive sleep apnea. RISK FACTORS  Risk factors include:  Being 28 or older.  Having high blood pressure (hypertension), high cholesterol (hyperlipidemia), or diabetes.  Drinking heavily, using tobacco products, or using drugs.  Being stressed. SIGNS AND SYMPTOMS  Signs and symptoms include:  Light-headedness.  Faintingor near fainting.  Fatigue and weakness.  Shortness of breath.  Chest pain (angina).  Drowsiness.  Confusion.  Dizziness. DIAGNOSIS  Diagnosis of bradycardia may include:  A physical exam.  An electrocardiogram (ECG).  Blood tests. TREATMENT  Treatment for bradycardia may include:  Treatment of an underlying condition.  Pacemaker placement. A pacemaker is a small, battery-powered device that is placed under the skin and is programmed to sense your heartbeats. If your heart  rate is lower than the programmed rate, the pacemaker will pace your heart.  Changing your medicines or dosages. HOME CARE INSTRUCTIONS  Take medicines only as directed by your health care provider.  Manage any health conditions that contribute to bradycardia as directed by your health care provider.  Follow a heart-healthy diet. A dietitian can help educate you on healthy food options and changes.  Follow an exercise program approved by your health care provider.  Maintain a healthy weight. Lose weight as approved by your health care provider.  Do not use tobacco products, including cigarettes, chewing tobacco, or electronic cigarettes. If you need help quitting, ask your health care provider.  Do not use illegal drugs.  Limit alcohol intake to no more than 1 drink per day for nonpregnant women and 2 drinks per day for  men. One drink equals 12 ounces of beer, 5 ounces of wine, or 1 ounces of hard liquor.  Keep all follow-up visits as directed by your health care provider. This is important. SEEK MEDICAL CARE IF:  You feel light-headed or dizzy.  You almost faint.  You feel weak or are easily fatigued during physical activity.  You experience confusion or have memory problems. SEEK IMMEDIATE MEDICAL CARE IF:   You faint.  You have an irregular heartbeat.  You have chest pain.  You have trouble breathing. MAKE SURE YOU:   Understand these instructions.  Will watch your condition.  Will get help right away if you are not doing well or get worse.   This information is not intended to replace advice given to you by your health care provider. Make sure you discuss any questions you have with your health care provider.   Document Released: 07/02/2002 Document Revised: 10/31/2014 Document Reviewed: 01/15/2014 Elsevier Interactive Patient Education [employer].

## 2016-02-18 NOTE — Assessment & Plan Note (Signed)
Did not see cardiology after last visit as recommended. HR still very low. Will get her into her cardiologist for follow up as she has not been seen. Do not want to stop metoprolol at this time due to allergies to other medicine and poorly controlled BP.

## 2016-02-18 NOTE — Assessment & Plan Note (Signed)
Checking levels again today. Await results.

## 2016-02-18 NOTE — Progress Notes (Signed)
BP 172/78 mmHg  Pulse 44  Temp(Src) 97.8 F (36.6 C)  Ht 4' 10.7" (1.491 m)  Wt 167 lb (75.751 kg)  BMI 34.07 kg/m2  SpO2 98%   Subjective:    Patient ID: Martha Singleton, female    DOB: 07-22-1946, 70 y.o.   MRN: [id number]  HPI: Martha Singleton is a 70 y.o. female  Chief Complaint  Patient presents with  . Anxiety    Patient states that she needs a refill on diazepam  . Allergies    Patient needs a refill on nasal spray  . Hypertension   ANXIETY/STRESS- has been having some problems, doesn't feel depressed, feels like she is worrying too much, not noticing any side effects from the effexor Duration:uncontrolled Anxious mood: yes  Excessive worrying: yes Irritability: yes  Sweating: yes Nausea: no Palpitations:no Hyperventilation: no Panic attacks: no Agoraphobia: no  Obscessions/compulsions: no Depressed mood: no Depression screen [facility] 2/9 02/18/2016 10/25/2015  Decreased Interest 0 1  Down, Depressed, Hopeless 0 1  PHQ - 2 Score 0 2  Altered sleeping 1 1  Tired, decreased energy 2 2  Change in appetite 3 2  Feeling bad or failure about yourself  0 1  Trouble concentrating 0 2  Moving slowly or fidgety/restless 1 0  Suicidal thoughts 0 0  PHQ-9 Score 7 10  Difficult doing work/chores Somewhat difficult -   Anhedonia: no Weight changes: no Insomnia: yes hard to fall asleep  Hypersomnia: no Fatigue/loss of energy: yes Feelings of worthlessness: no Feelings of guilt: no Impaired concentration/indecisiveness: yes Suicidal ideations: no  Crying spells: no Recent Stressors/Life Changes: yes  ALLERGIES Duration: chronic Runny nose: yes  Nasal congestion: yes Nasal itching: yes Sneezing: yes Eye swelling, itching or discharge: yes Post nasal drip: yes Cough: no Sinus pressure: yes  Ear pain: no  Ear pressure: no  Fever: no  Symptoms occur seasonally: yes Symptoms occur perenially: no Satisfied with current treatment: yes Allergist evaluation in past:  no Allergen injection immunotherapy: no Recurrent sinus infections: no ENT evaluation in past: no Known environmental allergy: yes Indoor pets: no History of asthma: no  HYPERTENSION / HYPERLIPIDEMIA Satisfied with current treatment? yes Duration of hypertension: chronic BP monitoring frequency: not checking BP medication side effects: no Duration of hyperlipidemia: chronic Cholesterol medication side effects: no Cholesterol supplements: none Medication compliance: excellent compliance Aspirin: no Recent stressors: no Recurrent headaches: no Visual changes: no Palpitations: no Dyspnea: no Chest pain: no Lower extremity edema: no Dizzy/lightheaded: no  Relevant past medical, surgical, family and social history reviewed and updated as indicated. Interim medical history since our last visit reviewed. Allergies and medications reviewed and updated.  Review of Systems  Constitutional: Negative.   Respiratory: Negative.   Cardiovascular: Negative.   Psychiatric/Behavioral: Negative for suicidal ideas, hallucinations, behavioral problems, confusion, sleep disturbance, self-injury, dysphoric mood, decreased concentration and agitation. The patient is nervous/anxious. The patient is not hyperactive.    Per HPI unless specifically indicated above     Objective:    BP 172/78 mmHg  Pulse 44  Temp(Src) 97.8 F (36.6 C)  Ht 4' 10.7" (1.491 m)  Wt 167 lb (75.751 kg)  BMI 34.07 kg/m2  SpO2 98%  Wt Readings from Last 3 Encounters:  02/18/16 167 lb (75.751 kg)  12/14/15 162 lb (73.483 kg)  10/22/15 159 lb (72.122 kg)    Physical Exam  Constitutional: She is oriented to person, place, and time. She appears well-developed and well-nourished. No distress.  HENT:  Head: Normocephalic  and atraumatic.  Right Ear: Hearing normal.  Left Ear: Hearing normal.  Nose: Nose normal.  Eyes: Conjunctivae and lids are normal. Right eye exhibits no discharge. Left eye exhibits no discharge.  No scleral icterus.  Cardiovascular: Regular rhythm, normal heart sounds and intact distal pulses.  Bradycardia present.  Exam reveals no gallop and no friction rub.   No murmur heard. Pulmonary/Chest: Effort normal. No respiratory distress. She has no wheezes. She has no rales. She exhibits no tenderness.  Musculoskeletal: Normal range of motion.  Neurological: She is alert and oriented to person, place, and time.  Skin: Skin is warm, dry and intact. No rash noted. No erythema. No pallor.  Psychiatric: She has a normal mood and affect. Her speech is normal and behavior is normal. Judgment and thought content normal. Cognition and memory are normal.  Nursing note and vitals reviewed.   Results for orders placed or performed in visit on 02/18/16  Microscopic Examination  Result Value Ref Range   WBC, UA None seen 0 -  5 /hpf   RBC, UA None seen 0 -  2 /hpf   Epithelial Cells (non renal) 0-10 0 - 10 /hpf   Bacteria, UA Few None seen/Few  Microalbumin, Urine Waived  Result Value Ref Range   Microalb, Ur Waived 150 (H) 0 - 19 mg/L   Creatinine, Urine Waived 50 10 - 300 mg/dL   Microalb/Creat Ratio >300 (H) <30 mg/g  UA/M w/rflx Culture, Routine  Result Value Ref Range   Specific Gravity, UA 1.010 1.005 - 1.030   pH, UA 7.0 5.0 - 7.5   Color, UA Yellow Yellow   Appearance Ur Clear Clear   Leukocytes, UA Negative Negative   Protein, UA 2+ (A) Negative/Trace   Glucose, UA Negative Negative   Ketones, UA Negative Negative   RBC, UA Negative Negative   Bilirubin, UA Negative Negative   Urobilinogen, Ur 0.2 0.2 - 1.0 mg/dL   Nitrite, UA Negative Negative   Microscopic Examination See below:       Assessment & Plan:   Problem List Items Addressed This Visit      Cardiovascular and Mediastinum   Essential hypertension - Primary    Slightly better on recheck, but still quite elevated. Patient wants BP managed by cardiology, doesn't want more than one person messing with her meds.  Many allergies to medications. Appointment with cardiology made for her today for Monday at 11:45. Referral generated today.      Relevant Orders   Comprehensive metabolic panel   TSH   Microalbumin, Urine Waived (Completed)   UA/M w/rflx Culture, Routine (Completed)   Ambulatory referral to Cardiology     Respiratory   Allergic rhinitis     Other   Generalized anxiety disorder    Not under good control. Long discussion with patient today regarding need to be seen often if she wants to continue diazepam. She will increase effexor to 25mg  daily. She is not comfortable with the plan for her anxiety. She does not want to see psychiatry. She has decided that she wants to change practices and see someone else for her care. Will continue to care for her for 1 month. Labs checked today.       Relevant Orders   Comprehensive metabolic panel   TSH   CBC with Differential/Platelet   UA/M w/rflx Culture, Routine (Completed)   Bradycardia    Did not see cardiology after last visit as recommended. HR still very low. Will get her  into her cardiologist for follow up as she has not been seen. Do not want to stop metoprolol at this time due to allergies to other medicine and poorly controlled BP.       Relevant Orders   EKG 12-Lead (Completed)   Ambulatory referral to Cardiology   Hyperlipidemia    Checking levels again today. Await results.       Relevant Orders   Comprehensive metabolic panel   Lipid Panel w/o Chol/HDL Ratio    Other Visit Diagnoses    Obese        Checking labs today. Await results.     Relevant Orders    Hgb A1c w/o eAG    ACTH    Cortisol        Follow up plan: Return Patient planning on leaving the practice. Will return if needed.

## 2016-02-18 NOTE — Assessment & Plan Note (Signed)
Not under good control. Long discussion with patient today regarding need to be seen often if she wants to continue diazepam. She will increase effexor to 25mg  daily. She is not comfortable with the plan for her anxiety. She does not want to see psychiatry. She has decided that she wants to change practices and see someone else for her care. Will continue to care for her for 1 month. Labs checked today.

## 2016-02-18 NOTE — Assessment & Plan Note (Addendum)
Slightly better on recheck, but still quite elevated. Patient wants BP managed by cardiology, doesn't want more than one person messing with her meds. Many allergies to medications. Appointment with cardiology made for her today for Monday at 11:45. Referral generated today.

## 2016-02-19 ENCOUNTER — Other Ambulatory Visit: Payer: Commercial Managed Care - HMO

## 2016-02-19 DIAGNOSIS — E669 Obesity, unspecified: Secondary | ICD-10-CM

## 2016-02-19 LAB — COMPREHENSIVE METABOLIC PANEL
A/G RATIO: 1.6 (ref 1.2–2.2)
ALT: 27 IU/L (ref 0–32)
AST: 24 IU/L (ref 0–40)
Albumin: 4.5 g/dL (ref 3.5–4.8)
Alkaline Phosphatase: 80 IU/L (ref 39–117)
BILIRUBIN TOTAL: 1.2 mg/dL (ref 0.0–1.2)
BUN/Creatinine Ratio: 16 (ref 12–28)
BUN: 11 mg/dL (ref 8–27)
CHLORIDE: 97 mmol/L (ref 96–106)
CO2: 24 mmol/L (ref 18–29)
Calcium: 9.8 mg/dL (ref 8.7–10.3)
Creatinine, Ser: 0.7 mg/dL (ref 0.57–1.00)
GFR calc Af Amer: 102 mL/min/{1.73_m2} (ref >59)
GFR calc non Af Amer: 88 mL/min/{1.73_m2} (ref >59)
Globulin, Total: 2.9 g/dL (ref 1.5–4.5)
Glucose: 115 mg/dL — ABNORMAL HIGH (ref 65–99)
POTASSIUM: 3.7 mmol/L (ref 3.5–5.2)
SODIUM: 139 mmol/L (ref 134–144)
Total Protein: 7.4 g/dL (ref 6.0–8.5)

## 2016-02-19 LAB — CBC WITH DIFFERENTIAL/PLATELET
Basophils Absolute: 0 10*3/uL (ref 0.0–0.2)
Basos: 1 %
EOS (ABSOLUTE): 0.1 10*3/uL (ref 0.0–0.4)
EOS: 1 %
HEMATOCRIT: 40.2 % (ref 34.0–46.6)
HEMOGLOBIN: 13.9 g/dL (ref 11.1–15.9)
IMMATURE GRANS (ABS): 0.1 10*3/uL (ref 0.0–0.1)
Immature Granulocytes: 1 %
LYMPHS ABS: 3 10*3/uL (ref 0.7–3.1)
LYMPHS: 37 %
MCH: 30.3 pg (ref 26.6–33.0)
MCHC: 34.6 g/dL (ref 31.5–35.7)
MCV: 88 fL (ref 79–97)
MONOCYTES: 6 %
Monocytes Absolute: 0.5 10*3/uL (ref 0.1–0.9)
NEUTROS ABS: 4.4 10*3/uL (ref 1.4–7.0)
Neutrophils: 54 %
Platelets: 260 10*3/uL (ref 150–379)
RBC: 4.59 x10E6/uL (ref 3.77–5.28)
RDW: 14.5 % (ref 12.3–15.4)
WBC: 8.1 10*3/uL (ref 3.4–10.8)

## 2016-02-19 LAB — LIPID PANEL W/O CHOL/HDL RATIO
Cholesterol, Total: 193 mg/dL (ref 100–199)
HDL: 52 mg/dL (ref >39)
LDL Calculated: 81 mg/dL (ref 0–99)
TRIGLYCERIDES: 300 mg/dL — AB (ref 0–149)
VLDL Cholesterol Cal: 60 mg/dL — ABNORMAL HIGH (ref 5–40)

## 2016-02-19 LAB — TSH: TSH: 1.42 u[IU]/mL (ref 0.450–4.500)

## 2016-02-19 LAB — HGB A1C W/O EAG: Hgb A1c MFr Bld: 6.7 % — ABNORMAL HIGH (ref 4.8–5.6)

## 2016-02-21 LAB — CORTISOL: CORTISOL: 15.6 ug/dL

## 2016-02-21 LAB — ACTH: ACTH: 11.3 pg/mL (ref 7.2–63.3)

## 2016-02-22 ENCOUNTER — Telehealth: Payer: Self-pay | Admitting: Family Medicine

## 2016-02-22 ENCOUNTER — Telehealth: Payer: Self-pay

## 2016-02-22 ENCOUNTER — Encounter: Payer: Self-pay | Admitting: Family Medicine

## 2016-02-22 DIAGNOSIS — R001 Bradycardia, unspecified: Secondary | ICD-10-CM | POA: Diagnosis not present

## 2016-02-22 DIAGNOSIS — I10 Essential (primary) hypertension: Secondary | ICD-10-CM | POA: Diagnosis not present

## 2016-02-22 DIAGNOSIS — E782 Mixed hyperlipidemia: Secondary | ICD-10-CM | POA: Diagnosis not present

## 2016-02-22 DIAGNOSIS — I493 Ventricular premature depolarization: Secondary | ICD-10-CM | POA: Diagnosis not present

## 2016-02-22 NOTE — Patient Instructions (Signed)
Diabetes Mellitus and Food It is important for you to manage your blood sugar (glucose) level. Your blood glucose level can be greatly affected by what you eat. Eating healthier foods in the appropriate amounts throughout the day at about the same time each day will help you control your blood glucose level. It can also help slow or prevent worsening of your diabetes mellitus. Healthy eating may even help you improve the level of your blood pressure and reach or maintain a healthy weight.  General recommendations for healthful eating and cooking habits include:  Eating meals and snacks regularly. Avoid going long periods of time without eating to lose weight.  Eating a diet that consists mainly of plant-based foods, such as fruits, vegetables, nuts, legumes, and whole grains.  Using low-heat cooking methods, such as baking, instead of high-heat cooking methods, such as deep frying. Work with your dietitian to make sure you understand how to use the Nutrition Facts information on food labels. HOW CAN FOOD AFFECT ME? Carbohydrates Carbohydrates affect your blood glucose level more than any other type of food. Your dietitian will help you determine how many carbohydrates to eat at each meal and teach you how to count carbohydrates. Counting carbohydrates is important to keep your blood glucose at a healthy level, especially if you are using insulin or taking certain medicines for diabetes mellitus. Alcohol Alcohol can cause sudden decreases in blood glucose (hypoglycemia), especially if you use insulin or take certain medicines for diabetes mellitus. Hypoglycemia can be a life-threatening condition. Symptoms of hypoglycemia (sleepiness, dizziness, and disorientation) are similar to symptoms of having too much alcohol.  If your health care provider has given you approval to drink alcohol, do so in moderation and use the following guidelines:  Women should not have more than one drink per day, and men  should not have more than two drinks per day. One drink is equal to:  12 oz of beer.  5 oz of wine.  1 oz of hard liquor.  Do not drink on an empty stomach.  Keep yourself hydrated. Have water, diet soda, or unsweetened iced tea.  Regular soda, juice, and other mixers might contain a lot of carbohydrates and should be counted. WHAT FOODS ARE NOT RECOMMENDED? As you make food choices, it is important to remember that all foods are not the same. Some foods have fewer nutrients per serving than other foods, even though they might have the same number of calories or carbohydrates. It is difficult to get your body what it needs when you eat foods with fewer nutrients. Examples of foods that you should avoid that are high in calories and carbohydrates but low in nutrients include:  Trans fats (most processed foods list trans fats on the Nutrition Facts label).  Regular soda.  Juice.  Candy.  Sweets, such as cake, pie, doughnuts, and cookies.  Fried foods. WHAT FOODS CAN I EAT? Eat nutrient-rich foods, which will nourish your body and keep you healthy. The food you should eat also will depend on several factors, including:  The calories you need.  The medicines you take.  Your weight.  Your blood glucose level.  Your blood pressure level.  Your cholesterol level. You should eat a variety of foods, including:  Protein.  Lean cuts of meat.  Proteins low in saturated fats, such as fish, egg whites, and beans. Avoid processed meats.  Fruits and vegetables.  Fruits and vegetables that may help control blood glucose levels, such as apples, mangoes, and   yams.  Dairy products.  Choose fat-free or low-fat dairy products, such as milk, yogurt, and cheese.  Grains, bread, pasta, and rice.  Choose whole grain products, such as multigrain bread, whole oats, and brown rice. These foods may help control blood pressure.  Fats.  Foods containing healthful fats, such as nuts,  avocado, olive oil, canola oil, and fish. DOES EVERYONE WITH DIABETES MELLITUS HAVE THE SAME MEAL PLAN? Because every person with diabetes mellitus is different, there is not one meal plan that works for everyone. It is very important that you meet with a dietitian who will help you create a meal plan that is just right for you.   This information is not intended to replace advice given to you by your health care provider. Make sure you discuss any questions you have with your health care provider.   Document Released: 07/07/2005 Document Revised: 10/31/2014 Document Reviewed: 09/06/2013 Elsevier Interactive Patient Education 2016 Elsevier Inc.  

## 2016-02-22 NOTE — Telephone Encounter (Signed)
She called this morning and requested Nasacort. Nasonex was written.

## 2016-02-22 NOTE — Telephone Encounter (Signed)
Patient notified

## 2016-02-22 NOTE — Telephone Encounter (Signed)
Please let her know that her labs all came back normal including the adrenal labs except her A1c which came back at 6.7. This is in the diet controlled diabetes range. She doesn't need any medicine at this time, but when she establishes with her new doctor, they should be made aware of it. I'm sending her a copy of all her labs as well. Any questions, she can let us know.

## 2016-02-22 NOTE — Telephone Encounter (Signed)
I spoke with patient and she said it was whatever she had last, I checked in PP, it was the nasonex. She was notified.

## 2016-02-26 ENCOUNTER — Ambulatory Visit: Payer: Self-pay | Admitting: Family Medicine

## 2016-03-09 ENCOUNTER — Other Ambulatory Visit: Payer: Self-pay | Admitting: Family Medicine

## 2016-03-09 MED ORDER — METOPROLOL SUCCINATE ER 25 MG PO TB24: 12.5000 mg | ORAL_TABLET | Freq: Every day | ORAL | Status: DC

## 2016-03-09 NOTE — Telephone Encounter (Signed)
Patient returned call and stated that she is planning on staying here until at least the first of the year because she is having trouble finding another provider.

## 2016-03-09 NOTE — Telephone Encounter (Signed)
Called and left patient a voicemail asking for her to please return my call.  

## 2016-03-09 NOTE — Telephone Encounter (Signed)
Routing to provider. Medication was last filled 10/22/15 and last appointment was 02/18/16. Pharmacy is [facility].

## 2016-03-09 NOTE — Telephone Encounter (Signed)
I am happy to refill this 1x- but she has said she is changing providers, can we find out if she has already changed or if she needs refills through the end of the month? Thanks!

## 2016-03-09 NOTE — Telephone Encounter (Signed)
Pt needs refill on metoprolol sent to [facility]

## 2016-03-23 DIAGNOSIS — R001 Bradycardia, unspecified: Secondary | ICD-10-CM | POA: Diagnosis not present

## 2016-03-23 DIAGNOSIS — I493 Ventricular premature depolarization: Secondary | ICD-10-CM | POA: Diagnosis not present

## 2016-03-23 DIAGNOSIS — I119 Hypertensive heart disease without heart failure: Secondary | ICD-10-CM | POA: Diagnosis not present

## 2016-03-23 DIAGNOSIS — I38 Endocarditis, valve unspecified: Secondary | ICD-10-CM | POA: Diagnosis not present

## 2016-03-23 DIAGNOSIS — I10 Essential (primary) hypertension: Secondary | ICD-10-CM | POA: Diagnosis not present

## 2016-03-23 DIAGNOSIS — E782 Mixed hyperlipidemia: Secondary | ICD-10-CM | POA: Diagnosis not present

## 2016-03-28 ENCOUNTER — Telehealth: Payer: Self-pay | Admitting: Family Medicine

## 2016-03-28 MED ORDER — METOPROLOL SUCCINATE ER 25 MG PO TB24: 12.5000 mg | ORAL_TABLET | Freq: Every day | ORAL | Status: DC

## 2016-03-28 NOTE — Telephone Encounter (Signed)
metoprolol succinate (TOPROL-XL) 25 MG 24 hr tablet Pharmacy: [id number] [place], [place] - [address]  Patient called for refill on her medication sent to her pharmacy. Thanks.

## 2016-03-28 NOTE — Telephone Encounter (Signed)
Forward to provider

## 2016-04-05 ENCOUNTER — Other Ambulatory Visit: Payer: Self-pay | Admitting: Family Medicine

## 2016-04-26 ENCOUNTER — Other Ambulatory Visit: Payer: Self-pay | Admitting: Family Medicine

## 2016-05-02 DIAGNOSIS — R001 Bradycardia, unspecified: Secondary | ICD-10-CM | POA: Diagnosis not present

## 2016-05-02 DIAGNOSIS — I493 Ventricular premature depolarization: Secondary | ICD-10-CM | POA: Diagnosis not present

## 2016-05-02 DIAGNOSIS — E782 Mixed hyperlipidemia: Secondary | ICD-10-CM | POA: Diagnosis not present

## 2016-05-02 DIAGNOSIS — I38 Endocarditis, valve unspecified: Secondary | ICD-10-CM | POA: Diagnosis not present

## 2016-05-02 DIAGNOSIS — I119 Hypertensive heart disease without heart failure: Secondary | ICD-10-CM | POA: Diagnosis not present

## 2016-05-02 DIAGNOSIS — I10 Essential (primary) hypertension: Secondary | ICD-10-CM | POA: Diagnosis not present

## 2016-05-30 ENCOUNTER — Encounter: Payer: Self-pay | Admitting: Family Medicine

## 2016-05-30 ENCOUNTER — Other Ambulatory Visit: Payer: Self-pay

## 2016-05-30 ENCOUNTER — Ambulatory Visit (INDEPENDENT_AMBULATORY_CARE_PROVIDER_SITE_OTHER): Payer: Commercial Managed Care - HMO | Admitting: Family Medicine

## 2016-05-30 VITALS — BP 151/78 | HR 72 | Temp 98.4°F | Wt 161.0 lb

## 2016-05-30 DIAGNOSIS — R338 Other retention of urine: Secondary | ICD-10-CM | POA: Diagnosis not present

## 2016-05-30 DIAGNOSIS — R39198 Other difficulties with micturition: Secondary | ICD-10-CM | POA: Diagnosis not present

## 2016-05-30 LAB — UA/M W/RFLX CULTURE, ROUTINE
BILIRUBIN UA: NEGATIVE
GLUCOSE, UA: NEGATIVE
KETONES UA: NEGATIVE
Leukocytes, UA: NEGATIVE
Nitrite, UA: NEGATIVE
RBC, UA: NEGATIVE
Specific Gravity, UA: 1.02 (ref 1.005–1.030)
UUROB: 0.2 mg/dL (ref 0.2–1.0)
pH, UA: 6 (ref 5.0–7.5)

## 2016-05-30 MED ORDER — CIPROFLOXACIN HCL 500 MG PO TABS: 500.0000 mg | ORAL_TABLET | Freq: Two times a day (BID) | ORAL | 0 refills | Status: DC

## 2016-05-30 MED ORDER — TAMSULOSIN HCL 0.4 MG PO CAPS: 0.4000 mg | ORAL_CAPSULE | Freq: Every day | ORAL | 0 refills | Status: DC

## 2016-05-30 NOTE — Patient Instructions (Signed)
Go to ER immediately for urinary retention. Follow up as needed.

## 2016-05-30 NOTE — Progress Notes (Signed)
BP (!) 151/78   Pulse 72   Temp 98.4 F (36.9 C)   Wt 161 lb (73 kg)   SpO2 98%   BMI 32.85 kg/m    Subjective:    Patient ID: Martha Singleton, female    DOB: 04/29/1946, 70 y.o.   MRN: [id number]  HPI: Martha Singleton is a 70 y.o. female  Chief Complaint  Patient presents with  . Urinary Retention    started last night, only been able to pass a few drops of urine. Lower back pain, burning when she tries to urinate. Has been drinking water all night trying to pee. She has been trying cranberry pills as well.   Patient presents with urinary retention since yesterday evening. Last void was 6 pm. Has been drinking water all night and sitting on the commode but can't get anything out. Having dysuria, urgency, and lower back pain. Also has severe nausea and abdominal pressure but has not vomited. Denies fever or chills. States this has happened all her life off and on during UTIs, most recently 6 months ago. Wears a pad daily for urinary incontinence. States the urologist has told her she has an abnormal urethra size and has been catheterized many many times. States she is usually given a course of cipro and she improves within a day or so. Has been taking tylenol and ibuprofen as needed.   Hx of bladder sling 15 years ago with most recent f/u about 5 years ago.    Past Medical History:  Diagnosis Date  . Anxiety   . Asthma   . Depression   . Diabetes mellitus type 2, diet-controlled ([place]) 02/18/16   A1c 6.7  . GERD (gastroesophageal reflux disease)   . Gout   . Hyperlipidemia   . Hypertension   . Lichen sclerosus   . Lumbago   . Obesity   . Osteopenia   . Sinus bradycardia   . Stress incontinence    Social History   Social History  . Marital status: Widowed    Spouse name: N/A  . Number of children: N/A  . Years of education: N/A   Occupational History  . Not on file.   Social History Main Topics  . Smoking status: Former Smoker    Quit date: 10/24/2000  . Smokeless  tobacco: Never Used  . Alcohol use Yes     Comment: socially  . Drug use: No  . Sexual activity: No   Other Topics Concern  . Not on file   Social History Narrative  . No narrative on file    Relevant past medical, surgical, family and social history reviewed and updated as indicated. Interim medical history since our last visit reviewed. Allergies and medications reviewed and updated.  Review of Systems  Constitutional: Negative for chills and fever.  HENT: Negative.   Respiratory: Negative.   Cardiovascular: Negative.   Gastrointestinal: Positive for abdominal pain and nausea. Negative for vomiting.  Genitourinary: Positive for difficulty urinating, dysuria, flank pain, pelvic pain and urgency.  Musculoskeletal: Positive for back pain.  Neurological: Negative.   Psychiatric/Behavioral: Negative.     Per HPI unless specifically indicated above     Objective:    BP (!) 151/78   Pulse 72   Temp 98.4 F (36.9 C)   Wt 161 lb (73 kg)   SpO2 98%   BMI 32.85 kg/m   Wt Readings from Last 3 Encounters:  05/30/16 161 lb (73 kg)  02/18/16 167 lb (75.8  kg)  12/14/15 162 lb (73.5 kg)    Physical Exam  Constitutional: She is oriented to person, place, and time. She appears well-developed and well-nourished.  HENT:  Head: Atraumatic.  Eyes: Conjunctivae are normal. No scleral icterus.  Neck: Normal range of motion. Neck supple.  Cardiovascular: Normal heart sounds.   Pulmonary/Chest: Effort normal and breath sounds normal.  Abdominal: Bowel sounds are normal. She exhibits distension (Significant bladder distention to palpation). There is tenderness (Suprapubic ).  Musculoskeletal: Normal range of motion.  Moderate CVA tenderness b/l  Neurological: She is alert and oriented to person, place, and time.  Skin: Skin is warm and dry.  Psychiatric: She has a normal mood and affect. Her behavior is normal.  Nursing note and vitals reviewed.     Assessment & Plan:   Problem  List Items Addressed This Visit    None    Visit Diagnoses    Acute urinary retention    -  Primary   Relevant Orders   UA/M w/rflx Culture, Routine (STAT)    Cipro and flomax sent to pharmacy. Urged patient repeatedly to go to the ER for immediate catheterization and evaluation. Patient refused recommendation. She states she has catheters at home and will just wait and see if the medicines work first. I reinforced that significant damage could be incurred by the time the medications had time to take effect and that if nothing else she should immediately catheterize herself at home, but patient is concerned about creating scar tissue in her urethra. Discussion was had about risk and benefit of catheterizing and patient states she is wanting to wait despite that. She assures me that she will go to the ER when she feels it is at that point. Discussed that U/A of the several drops we received did not indicate a UTI and that may not be the cause of current retention episode. Patient understands recommendation.   Also recommended she go back as soon as possible to see Urologist.   Follow up plan: Return if symptoms worsen or fail to improve.

## 2016-06-01 DIAGNOSIS — I10 Essential (primary) hypertension: Secondary | ICD-10-CM | POA: Diagnosis not present

## 2016-06-01 DIAGNOSIS — R42 Dizziness and giddiness: Secondary | ICD-10-CM | POA: Diagnosis not present

## 2016-06-01 DIAGNOSIS — I119 Hypertensive heart disease without heart failure: Secondary | ICD-10-CM | POA: Diagnosis not present

## 2016-06-01 DIAGNOSIS — I493 Ventricular premature depolarization: Secondary | ICD-10-CM | POA: Diagnosis not present

## 2016-07-04 ENCOUNTER — Other Ambulatory Visit: Payer: Self-pay | Admitting: Family Medicine

## 2016-07-05 ENCOUNTER — Other Ambulatory Visit: Payer: Self-pay | Admitting: Family Medicine

## 2016-07-15 ENCOUNTER — Telehealth: Payer: Self-pay

## 2016-07-15 NOTE — Telephone Encounter (Signed)
Patient given dates of pneumonia vaccine.

## 2016-07-15 NOTE — Telephone Encounter (Signed)
Patient called wanting to know when her last pneumonia vaccine was.

## 2016-07-18 ENCOUNTER — Telehealth: Payer: Self-pay

## 2016-07-18 MED ORDER — TAMSULOSIN HCL 0.4 MG PO CAPS: 0.4000 mg | ORAL_CAPSULE | Freq: Every day | ORAL | 3 refills | Status: DC

## 2016-07-18 NOTE — Telephone Encounter (Signed)
Tamsulosin 0.4mg  take one capsule daily. [place]

## 2016-07-27 ENCOUNTER — Other Ambulatory Visit: Payer: Self-pay | Admitting: Family Medicine

## 2016-07-28 NOTE — Telephone Encounter (Signed)
apt 

## 2016-08-08 NOTE — Telephone Encounter (Signed)
Left several messages for pt to call back to schedule an appt.

## 2016-09-06 ENCOUNTER — Ambulatory Visit: Payer: Commercial Managed Care - HMO | Admitting: Family Medicine

## 2016-09-08 ENCOUNTER — Other Ambulatory Visit: Payer: Self-pay | Admitting: Family Medicine

## 2016-09-08 NOTE — Telephone Encounter (Signed)
Routing to provider  

## 2016-09-13 ENCOUNTER — Ambulatory Visit: Payer: Commercial Managed Care - HMO | Admitting: Family Medicine

## 2016-09-13 ENCOUNTER — Telehealth: Payer: Self-pay | Admitting: Family Medicine

## 2016-09-14 ENCOUNTER — Other Ambulatory Visit: Payer: Self-pay

## 2016-09-14 ENCOUNTER — Ambulatory Visit: Payer: Commercial Managed Care - HMO | Admitting: Family Medicine

## 2016-09-14 MED ORDER — AMLODIPINE BESYLATE 10 MG PO TABS: 10.0000 mg | ORAL_TABLET | Freq: Every day | ORAL | 0 refills | Status: AC

## 2016-09-14 MED ORDER — ATORVASTATIN CALCIUM 20 MG PO TABS: ORAL_TABLET | ORAL | 0 refills | Status: AC

## 2016-09-14 NOTE — Telephone Encounter (Signed)
Rx's sent to mail order, will renew after medication management visit

## 2016-10-03 ENCOUNTER — Ambulatory Visit: Payer: Commercial Managed Care - HMO | Admitting: Family Medicine

## 2016-11-04 ENCOUNTER — Ambulatory Visit: Payer: Self-pay | Admitting: Family Medicine

## 2016-11-07 ENCOUNTER — Ambulatory Visit (INDEPENDENT_AMBULATORY_CARE_PROVIDER_SITE_OTHER): Payer: Medicare HMO | Admitting: Family Medicine

## 2016-11-07 ENCOUNTER — Encounter: Payer: Self-pay | Admitting: Family Medicine

## 2016-11-07 VITALS — BP 185/83 | HR 43 | Temp 97.9°F | Wt 167.0 lb

## 2016-11-07 DIAGNOSIS — M25572 Pain in left ankle and joints of left foot: Secondary | ICD-10-CM | POA: Diagnosis not present

## 2016-11-07 DIAGNOSIS — J0100 Acute maxillary sinusitis, unspecified: Secondary | ICD-10-CM

## 2016-11-07 DIAGNOSIS — I10 Essential (primary) hypertension: Secondary | ICD-10-CM | POA: Diagnosis not present

## 2016-11-07 MED ORDER — TRAMADOL HCL 50 MG PO TABS: 50.0000 mg | ORAL_TABLET | Freq: Three times a day (TID) | ORAL | 0 refills | Status: DC | PRN

## 2016-11-07 MED ORDER — AZITHROMYCIN 250 MG PO TABS: ORAL_TABLET | ORAL | 0 refills | Status: DC

## 2016-11-07 MED ORDER — PREDNISONE 20 MG PO TABS: 40.0000 mg | ORAL_TABLET | Freq: Every day | ORAL | 0 refills | Status: DC

## 2016-11-07 NOTE — Assessment & Plan Note (Signed)
Will defer to Cardiology per follow up in 3 weeks. Hopefully by then joint pain and sinusitis will be under good control and BP will settle. Continue current regimen in the meantime.

## 2016-11-07 NOTE — Progress Notes (Signed)
BP (!) 185/83   Pulse (!) 43   Temp 97.9 F (36.6 C)   Wt 167 lb (75.8 kg)   SpO2 97%   BMI 34.08 kg/m    Subjective:    Patient ID: Martha Singleton, female    DOB: June 12, 1946, 71 y.o.   MRN: [id number]  HPI: Martha Singleton is a 71 y.o. female  Chief Complaint  Patient presents with  . Sinusitis    x 2 weeks, blowing out some bloody mucus, head pressure, sinus drainage. No cough.   . Medication Refill    she needs refills on Telmisartin, Atorvastatin, Amlodipine, HCTZ, Hydrocortisone cream  . Gout    left ankle and foot x 2 weeks  . Medication Refill    she needs a written rx for Clobetasol cream   Has been having sinus pressure, pain, and congestion for about 2 weeks now. Has blown her nose and had several large quarter sized bloody pieces of mucus come out. Hx of sinus surgeries to remove polyps, has had a lot of sinus issues in the past. Denies fever, cough, SOB, ear pain. Taking several OTC cold medications with no relief.   Left foot/ankle pain and down to great toe, hx of gout and feels like this is a flare of that. Areas have been red, swollen, and severely painful with movement. Weight bearing is excruciating but she has been walking on it. States she did have a beer on NYE which has been a big trigger for her gout in the past. Taking tylenol, states about 10 per day with minimal relief.   She would also like her BP medications refilled. States she has been doing well on current regimen, feels like BP is up right now d/t pain and OTC decongestants. Scheduled to see Cardiology next month for follow up.   Past Medical History:  Diagnosis Date  . Anxiety   . Asthma   . Depression   . Diabetes mellitus type 2, diet-controlled ([place]) 02/18/16   A1c 6.7  . GERD (gastroesophageal reflux disease)   . Gout   . Hyperlipidemia   . Hypertension   . Lichen sclerosus   . Lumbago   . Obesity   . Osteopenia   . Sinus bradycardia   . Stress incontinence    Social History    Social History  . Marital status: Widowed    Spouse name: N/A  . Number of children: N/A  . Years of education: N/A   Occupational History  . Not on file.   Social History Main Topics  . Smoking status: Former Smoker    Quit date: 10/24/2000  . Smokeless tobacco: Never Used  . Alcohol use Yes     Comment: socially  . Drug use: No  . Sexual activity: No   Other Topics Concern  . Not on file   Social History Narrative  . No narrative on file    Relevant past medical, surgical, family and social history reviewed and updated as indicated. Interim medical history since our last visit reviewed. Allergies and medications reviewed and updated.  Review of Systems  Constitutional: Negative.   HENT: Positive for congestion, sinus pain and sinus pressure.   Eyes: Negative.   Respiratory: Negative.   Cardiovascular: Negative.   Gastrointestinal: Negative.   Genitourinary: Negative.   Musculoskeletal: Positive for arthralgias.  Neurological: Negative.   Psychiatric/Behavioral: Negative.     Per HPI unless specifically indicated above     Objective:    BP Marland Kitchen)  185/83   Pulse (!) 43   Temp 97.9 F (36.6 C)   Wt 167 lb (75.8 kg)   SpO2 97%   BMI 34.08 kg/m   Wt Readings from Last 3 Encounters:  11/07/16 167 lb (75.8 kg)  05/30/16 161 lb (73 kg)  02/18/16 167 lb (75.8 kg)    Physical Exam  Constitutional: She is oriented to person, place, and time. She appears well-developed and well-nourished. No distress.  HENT:  Head: Atraumatic.  TTP over b/l frontal and maxillary sinusitis Nasal mucosa injected Oropharynx erythematous  Eyes: Conjunctivae are normal. Pupils are equal, round, and reactive to light.  Neck: Normal range of motion. Neck supple.  Cardiovascular: Normal rate.   Pulmonary/Chest: Effort normal. No respiratory distress.  Musculoskeletal: Normal range of motion.  TTP over base of left great toe and lateral malleolus No erythema or edema noted   Neurological: She is alert and oriented to person, place, and time.  Skin: Skin is warm and dry.  Psychiatric: She has a normal mood and affect. Her behavior is normal.  Nursing note and vitals reviewed.     Assessment & Plan:   Problem List Items Addressed This Visit      Cardiovascular and Mediastinum   Essential hypertension    Will defer to Cardiology per follow up in 3 weeks. Hopefully by then joint pain and sinusitis will be under good control and BP will settle. Continue current regimen in the meantime.        Other Visit Diagnoses    Arthralgia of left foot    -  Primary   Await uric acid and CMP results. Discussed cutting back on tylenol. Prednisone burst and tramadol prn for current flare. Rest, elevation.    Relevant Orders   Comprehensive metabolic panel   Uric acid   Acute maxillary sinusitis, recurrence not specified       Will treat with azithromycin. Prednisone for joint pain should help. Continue supportive care. Follow up if no improvement.    Relevant Medications   predniSONE (DELTASONE) 20 MG tablet   azithromycin (ZITHROMAX) 250 MG tablet       Follow up plan: Return for as scheduled with Cardiology.

## 2016-11-07 NOTE — Patient Instructions (Signed)
Follow up as needed

## 2016-11-08 ENCOUNTER — Telehealth: Payer: Self-pay | Admitting: Family Medicine

## 2016-11-08 LAB — COMPREHENSIVE METABOLIC PANEL
A/G RATIO: 1.4 (ref 1.2–2.2)
ALBUMIN: 4.4 g/dL (ref 3.5–4.8)
ALK PHOS: 85 IU/L (ref 39–117)
ALT: 51 IU/L — ABNORMAL HIGH (ref 0–32)
AST: 36 IU/L (ref 0–40)
BUN / CREAT RATIO: 22 (ref 12–28)
BUN: 17 mg/dL (ref 8–27)
Bilirubin Total: 1.5 mg/dL — ABNORMAL HIGH (ref 0.0–1.2)
CHLORIDE: 98 mmol/L (ref 96–106)
CO2: 25 mmol/L (ref 18–29)
Calcium: 10.2 mg/dL (ref 8.7–10.3)
Creatinine, Ser: 0.79 mg/dL (ref 0.57–1.00)
GFR calc non Af Amer: 76 mL/min/{1.73_m2} (ref >59)
GFR, EST AFRICAN AMERICAN: 88 mL/min/{1.73_m2} (ref >59)
Globulin, Total: 3.1 g/dL (ref 1.5–4.5)
Glucose: 163 mg/dL — ABNORMAL HIGH (ref 65–99)
POTASSIUM: 4 mmol/L (ref 3.5–5.2)
Sodium: 139 mmol/L (ref 134–144)
TOTAL PROTEIN: 7.5 g/dL (ref 6.0–8.5)

## 2016-11-08 LAB — URIC ACID: Uric Acid: 8.8 mg/dL — ABNORMAL HIGH (ref 2.5–7.1)

## 2016-11-08 MED ORDER — COLCHICINE 0.6 MG PO TABS: ORAL_TABLET | ORAL | 0 refills | Status: DC

## 2016-11-08 NOTE — Telephone Encounter (Signed)
Patient notified, understood and agreed to come in for lab recheck.

## 2016-11-08 NOTE — Telephone Encounter (Signed)
Please call pt and let her know her uric acid level was elevated, indicating that her joint pains likely are from a gout flare. I have sent in colchicine for her to start right away, and the prednisone should also help things along. Her liver enzymes were a bit abnormal likely from the heavy tylenol use. Let's recheck everything in about 3-4 weeks once this flare calms down. Make sure to stay away from tylenol and alcohol in the meantime to give her liver a break. Take the tramadol prn for pain relief.

## 2016-11-11 ENCOUNTER — Telehealth: Payer: Self-pay | Admitting: Family Medicine

## 2016-11-11 MED ORDER — PREDNISONE 20 MG PO TABS: 40.0000 mg | ORAL_TABLET | Freq: Every day | ORAL | 0 refills | Status: DC

## 2016-11-11 MED ORDER — ALLOPURINOL 100 MG PO TABS: 100.0000 mg | ORAL_TABLET | Freq: Every day | ORAL | 0 refills | Status: DC

## 2016-11-11 NOTE — Telephone Encounter (Signed)
Patient notified

## 2016-11-11 NOTE — Telephone Encounter (Signed)
Patient called to see if Dr Wynetta Emery or Apolonio Schneiders could give her any suggestions on something in place of the gout medication that [facility] prescribed as she cannot afford 203.00.  I gave her the information regarding going on website to see if she could print a coupon off or either looking at the list of approved meds on her ins list.  She wanted me to see if the drs could advise.  Thank Jerline Pain  [phone]

## 2016-11-11 NOTE — Telephone Encounter (Signed)
Routing to provider for advice.

## 2016-11-11 NOTE — Telephone Encounter (Signed)
Left message to call.

## 2016-11-11 NOTE — Telephone Encounter (Signed)
Please call pt and let her know that we don't have any good savings cards for her for the colchicine, so let's extend the prednisone and start her on allopurinol which will be a long term medication to help suppress gout flares. Take 1 tablet daily week one, then 2 tablets daily week 2, then 3 tablets daily week three. Stay on 3 tablets daily from then on. Let's recheck her levels in 1 month to make sure her uric acid and kidney function are doing well.

## 2016-11-16 ENCOUNTER — Ambulatory Visit (INDEPENDENT_AMBULATORY_CARE_PROVIDER_SITE_OTHER): Payer: Medicare HMO | Admitting: Family Medicine

## 2016-11-16 ENCOUNTER — Encounter: Payer: Self-pay | Admitting: Family Medicine

## 2016-11-16 ENCOUNTER — Other Ambulatory Visit: Payer: Self-pay

## 2016-11-16 VITALS — BP 159/81 | HR 84 | Temp 99.2°F | Wt 164.0 lb

## 2016-11-16 DIAGNOSIS — N390 Urinary tract infection, site not specified: Secondary | ICD-10-CM

## 2016-11-16 DIAGNOSIS — J101 Influenza due to other identified influenza virus with other respiratory manifestations: Secondary | ICD-10-CM

## 2016-11-16 DIAGNOSIS — R1030 Lower abdominal pain, unspecified: Secondary | ICD-10-CM | POA: Diagnosis not present

## 2016-11-16 DIAGNOSIS — R509 Fever, unspecified: Secondary | ICD-10-CM | POA: Diagnosis not present

## 2016-11-16 LAB — VERITOR FLU A/B WAIVED
Influenza A: POSITIVE — AB
Influenza B: NEGATIVE

## 2016-11-16 MED ORDER — OSELTAMIVIR PHOSPHATE 75 MG PO CAPS: 75.0000 mg | ORAL_CAPSULE | Freq: Two times a day (BID) | ORAL | 0 refills | Status: DC

## 2016-11-16 MED ORDER — SULFAMETHOXAZOLE-TRIMETHOPRIM 800-160 MG PO TABS: 1.0000 | ORAL_TABLET | Freq: Two times a day (BID) | ORAL | 0 refills | Status: DC

## 2016-11-16 NOTE — Progress Notes (Signed)
BP (!) 159/81   Pulse 84   Temp 99.2 F (37.3 C)   Wt 164 lb (74.4 kg)   SpO2 97%   BMI 33.46 kg/m    Subjective:    Patient ID: Martha Singleton, female    DOB: Dec 29, 1945, 71 y.o.   MRN: [id number]  HPI: Martha Singleton is a 71 y.o. female  Chief Complaint  Patient presents with  . Fever    x 1 day, chills, RLQ pain, some stiff neck but states she slept on the couch last night   Patient presents with 1 day history of sudden onset chills, high fever, body aches, confusion. Also having LLQ pain and pressure. Denies sore throat, cough, congestion, CP, SOB, urinary frequency, dysuria, urgency, N/V/D. Taking tylenol with mild relief. No sick contacts reported.   Past Medical History:  Diagnosis Date  . Anxiety   . Asthma   . Depression   . Diabetes mellitus type 2, diet-controlled ([place]) 02/18/16   A1c 6.7  . GERD (gastroesophageal reflux disease)   . Gout   . Hyperlipidemia   . Hypertension   . Lichen sclerosus   . Lumbago   . Obesity   . Osteopenia   . Sinus bradycardia   . Stress incontinence    Social History   Social History  . Marital status: Widowed    Spouse name: N/A  . Number of children: N/A  . Years of education: N/A   Occupational History  . Not on file.   Social History Main Topics  . Smoking status: Former Smoker    Quit date: 10/24/2000  . Smokeless tobacco: Never Used  . Alcohol use Yes     Comment: socially  . Drug use: No  . Sexual activity: No   Other Topics Concern  . Not on file   Social History Narrative  . No narrative on file    Relevant past medical, surgical, family and social history reviewed and updated as indicated. Interim medical history since our last visit reviewed. Allergies and medications reviewed and updated.  Review of Systems  Constitutional: Positive for chills, diaphoresis, fatigue and fever.  HENT: Negative.   Eyes: Negative.   Respiratory: Negative.   Cardiovascular: Negative.   Gastrointestinal: Positive  for abdominal pain.  Genitourinary: Negative.   Musculoskeletal: Positive for myalgias.  Psychiatric/Behavioral: Positive for confusion.    Per HPI unless specifically indicated above     Objective:    BP (!) 159/81   Pulse 84   Temp 99.2 F (37.3 C)   Wt 164 lb (74.4 kg)   SpO2 97%   BMI 33.46 kg/m   Wt Readings from Last 3 Encounters:  11/16/16 164 lb (74.4 kg)  11/07/16 167 lb (75.8 kg)  05/30/16 161 lb (73 kg)    Physical Exam  Constitutional: She is oriented to person, place, and time. She appears well-developed and well-nourished.  HENT:  Head: Atraumatic.  Right Ear: External ear normal.  Left Ear: External ear normal.  Nose: Nose normal.  Mouth/Throat: Oropharynx is clear and moist. No oropharyngeal exudate.  Eyes: Conjunctivae are normal. Pupils are equal, round, and reactive to light.  Neck: Normal range of motion. Neck supple.  Cardiovascular: Normal rate.   Pulmonary/Chest: Effort normal and breath sounds normal. No respiratory distress.  Abdominal: Soft. Bowel sounds are normal. She exhibits no distension. There is tenderness (moderate TTP in LLQ). There is no rebound.  Musculoskeletal: Normal range of motion.  Lymphadenopathy:    She  has no cervical adenopathy.  Neurological: She is alert and oriented to person, place, and time.  Skin: Skin is warm and dry.  Psychiatric: She has a normal mood and affect. Her behavior is normal.      Assessment & Plan:   Problem List Items Addressed This Visit    None    Visit Diagnoses    Lower urinary tract infection    -  Primary   Relevant Medications   sulfamethoxazole-trimethoprim (BACTRIM DS,SEPTRA DS) 800-160 MG tablet   oseltamivir (TAMIFLU) 75 MG capsule   Other Relevant Orders   UA/M w/rflx Culture, Routine (STAT)   CBC With Differential/Platelet   Influenza A       Relevant Medications   sulfamethoxazole-trimethoprim (BACTRIM DS,SEPTRA DS) 800-160 MG tablet   oseltamivir (TAMIFLU) 75 MG capsule    Other Relevant Orders   CBC With Differential/Platelet   Influenza A & B (STAT)    CBC WNL, U/A + for UTI, and rapid flu positive for Flu A. Will treat with bactrim and tamiflu. Discussed supportive care including pushing fluids. Await urine cx. Follow up if no improvement.   Follow up plan: Return if symptoms worsen or fail to improve.

## 2016-11-16 NOTE — Patient Instructions (Signed)
Follow up as needed

## 2016-11-18 LAB — MICROSCOPIC EXAMINATION

## 2016-11-18 LAB — UA/M W/RFLX CULTURE, ROUTINE
BILIRUBIN UA: NEGATIVE
GLUCOSE, UA: NEGATIVE
Nitrite, UA: NEGATIVE
RBC, UA: NEGATIVE
Specific Gravity, UA: 1.02 (ref 1.005–1.030)
Urobilinogen, Ur: 1 mg/dL (ref 0.2–1.0)
pH, UA: 6.5 (ref 5.0–7.5)

## 2016-11-18 LAB — URINE CULTURE, REFLEX: Organism ID, Bacteria: NO GROWTH

## 2016-11-18 LAB — CBC WITH DIFFERENTIAL/PLATELET
Hematocrit: 43.3 % (ref 34.0–46.6)
Hemoglobin: 15.2 g/dL (ref 11.1–15.9)
LYMPHS ABS: 1.4 10*3/uL (ref 0.7–3.1)
LYMPHS: 15 %
MCH: 30.9 pg (ref 26.6–33.0)
MCHC: 35.1 g/dL (ref 31.5–35.7)
MCV: 88 fL (ref 79–97)
MID (Absolute): 0.8 10*3/uL (ref 0.1–1.6)
MID: 8 %
NEUTROS PCT: 77 %
Neutrophils Absolute: 7.4 10*3/uL — ABNORMAL HIGH (ref 1.4–7.0)
Platelets: 265 10*3/uL (ref 150–379)
RBC: 4.92 x10E6/uL (ref 3.77–5.28)
RDW: 13.7 % (ref 12.3–15.4)
WBC: 9.6 10*3/uL (ref 3.4–10.8)

## 2016-12-05 DIAGNOSIS — Z1231 Encounter for screening mammogram for malignant neoplasm of breast: Secondary | ICD-10-CM | POA: Diagnosis not present

## 2016-12-05 DIAGNOSIS — R7303 Prediabetes: Secondary | ICD-10-CM | POA: Diagnosis not present

## 2016-12-05 DIAGNOSIS — Z1329 Encounter for screening for other suspected endocrine disorder: Secondary | ICD-10-CM | POA: Diagnosis not present

## 2016-12-05 DIAGNOSIS — Z79899 Other long term (current) drug therapy: Secondary | ICD-10-CM | POA: Diagnosis not present

## 2016-12-05 DIAGNOSIS — I38 Endocarditis, valve unspecified: Secondary | ICD-10-CM | POA: Diagnosis not present

## 2016-12-05 DIAGNOSIS — E782 Mixed hyperlipidemia: Secondary | ICD-10-CM | POA: Diagnosis not present

## 2016-12-05 DIAGNOSIS — J40 Bronchitis, not specified as acute or chronic: Secondary | ICD-10-CM | POA: Diagnosis not present

## 2016-12-05 DIAGNOSIS — Z1211 Encounter for screening for malignant neoplasm of colon: Secondary | ICD-10-CM | POA: Diagnosis not present

## 2016-12-05 DIAGNOSIS — R61 Generalized hyperhidrosis: Secondary | ICD-10-CM | POA: Diagnosis not present

## 2016-12-05 DIAGNOSIS — Z0000 Encounter for general adult medical examination without abnormal findings: Secondary | ICD-10-CM | POA: Diagnosis not present

## 2016-12-05 DIAGNOSIS — I10 Essential (primary) hypertension: Secondary | ICD-10-CM | POA: Diagnosis not present

## 2016-12-09 ENCOUNTER — Other Ambulatory Visit: Payer: Self-pay | Admitting: Family Medicine

## 2016-12-12 DIAGNOSIS — I10 Essential (primary) hypertension: Secondary | ICD-10-CM | POA: Diagnosis not present

## 2016-12-12 DIAGNOSIS — Z79899 Other long term (current) drug therapy: Secondary | ICD-10-CM | POA: Diagnosis not present

## 2016-12-12 DIAGNOSIS — Z1329 Encounter for screening for other suspected endocrine disorder: Secondary | ICD-10-CM | POA: Diagnosis not present

## 2016-12-12 DIAGNOSIS — E782 Mixed hyperlipidemia: Secondary | ICD-10-CM | POA: Diagnosis not present

## 2016-12-12 DIAGNOSIS — R7303 Prediabetes: Secondary | ICD-10-CM | POA: Diagnosis not present

## 2016-12-13 ENCOUNTER — Other Ambulatory Visit: Payer: Self-pay | Admitting: Internal Medicine

## 2016-12-13 ENCOUNTER — Other Ambulatory Visit: Payer: Self-pay | Admitting: Family Medicine

## 2016-12-13 DIAGNOSIS — Z1231 Encounter for screening mammogram for malignant neoplasm of breast: Secondary | ICD-10-CM

## 2016-12-29 DIAGNOSIS — I10 Essential (primary) hypertension: Secondary | ICD-10-CM | POA: Diagnosis not present

## 2016-12-29 DIAGNOSIS — Z79899 Other long term (current) drug therapy: Secondary | ICD-10-CM | POA: Diagnosis not present

## 2016-12-29 DIAGNOSIS — E119 Type 2 diabetes mellitus without complications: Secondary | ICD-10-CM | POA: Insufficient documentation

## 2017-01-04 ENCOUNTER — Ambulatory Visit
Admission: RE | Admit: 2017-01-04 | Discharge: 2017-01-04 | Disposition: A | Discharge status: Home / Self Care | Payer: Commercial Managed Care - HMO | Source: Ambulatory Visit | Attending: Internal Medicine | Admitting: Internal Medicine

## 2017-01-04 DIAGNOSIS — Z1231 Encounter for screening mammogram for malignant neoplasm of breast: Secondary | ICD-10-CM | POA: Diagnosis not present

## 2017-01-04 DIAGNOSIS — N6489 Other specified disorders of breast: Secondary | ICD-10-CM | POA: Diagnosis not present

## 2017-01-06 ENCOUNTER — Other Ambulatory Visit: Payer: Self-pay | Admitting: Internal Medicine

## 2017-01-06 DIAGNOSIS — R928 Other abnormal and inconclusive findings on diagnostic imaging of breast: Secondary | ICD-10-CM

## 2017-01-06 DIAGNOSIS — N6489 Other specified disorders of breast: Secondary | ICD-10-CM

## 2017-01-13 ENCOUNTER — Other Ambulatory Visit: Payer: Self-pay | Admitting: Family Medicine

## 2017-01-17 ENCOUNTER — Ambulatory Visit
Admission: RE | Admit: 2017-01-17 | Discharge: 2017-01-17 | Disposition: A | Discharge status: Home / Self Care | Payer: Commercial Managed Care - HMO | Source: Ambulatory Visit | Attending: Internal Medicine | Admitting: Internal Medicine

## 2017-01-17 DIAGNOSIS — N6489 Other specified disorders of breast: Secondary | ICD-10-CM

## 2017-01-17 DIAGNOSIS — R928 Other abnormal and inconclusive findings on diagnostic imaging of breast: Secondary | ICD-10-CM

## 2017-01-17 DIAGNOSIS — N6312 Unspecified lump in the right breast, upper inner quadrant: Secondary | ICD-10-CM | POA: Diagnosis not present

## 2017-01-30 ENCOUNTER — Telehealth: Payer: Self-pay

## 2017-01-30 NOTE — Telephone Encounter (Signed)
Attempted to reach to schedule for Medicare Wellness. Message left for pt to return call.   

## 2017-02-13 DIAGNOSIS — E119 Type 2 diabetes mellitus without complications: Secondary | ICD-10-CM | POA: Diagnosis not present

## 2017-04-03 DIAGNOSIS — D1800 Hemangioma unspecified site: Secondary | ICD-10-CM | POA: Diagnosis not present

## 2017-04-03 DIAGNOSIS — L219 Seborrheic dermatitis, unspecified: Secondary | ICD-10-CM | POA: Diagnosis not present

## 2017-04-03 DIAGNOSIS — L720 Epidermal cyst: Secondary | ICD-10-CM | POA: Diagnosis not present

## 2017-04-03 DIAGNOSIS — L821 Other seborrheic keratosis: Secondary | ICD-10-CM | POA: Diagnosis not present

## 2017-04-03 DIAGNOSIS — D485 Neoplasm of uncertain behavior of skin: Secondary | ICD-10-CM | POA: Diagnosis not present

## 2017-04-03 DIAGNOSIS — L578 Other skin changes due to chronic exposure to nonionizing radiation: Secondary | ICD-10-CM | POA: Diagnosis not present

## 2017-04-03 DIAGNOSIS — L820 Inflamed seborrheic keratosis: Secondary | ICD-10-CM | POA: Diagnosis not present

## 2017-04-03 DIAGNOSIS — D2111 Benign neoplasm of connective and other soft tissue of right upper limb, including shoulder: Secondary | ICD-10-CM | POA: Diagnosis not present

## 2017-04-24 DIAGNOSIS — E039 Hypothyroidism, unspecified: Secondary | ICD-10-CM | POA: Diagnosis not present

## 2017-04-24 DIAGNOSIS — I10 Essential (primary) hypertension: Secondary | ICD-10-CM | POA: Diagnosis not present

## 2017-04-24 DIAGNOSIS — E782 Mixed hyperlipidemia: Secondary | ICD-10-CM | POA: Diagnosis not present

## 2017-04-24 DIAGNOSIS — E119 Type 2 diabetes mellitus without complications: Secondary | ICD-10-CM | POA: Diagnosis not present

## 2017-04-24 DIAGNOSIS — Z79899 Other long term (current) drug therapy: Secondary | ICD-10-CM | POA: Diagnosis not present

## 2017-04-25 DIAGNOSIS — E039 Hypothyroidism, unspecified: Secondary | ICD-10-CM | POA: Diagnosis not present

## 2017-04-25 DIAGNOSIS — E782 Mixed hyperlipidemia: Secondary | ICD-10-CM | POA: Diagnosis not present

## 2017-04-25 DIAGNOSIS — I10 Essential (primary) hypertension: Secondary | ICD-10-CM | POA: Diagnosis not present

## 2017-04-25 DIAGNOSIS — E119 Type 2 diabetes mellitus without complications: Secondary | ICD-10-CM | POA: Diagnosis not present

## 2017-04-25 DIAGNOSIS — Z79899 Other long term (current) drug therapy: Secondary | ICD-10-CM | POA: Diagnosis not present

## 2017-05-30 DIAGNOSIS — E119 Type 2 diabetes mellitus without complications: Secondary | ICD-10-CM | POA: Diagnosis not present

## 2017-05-30 DIAGNOSIS — I10 Essential (primary) hypertension: Secondary | ICD-10-CM | POA: Diagnosis not present

## 2017-06-27 DIAGNOSIS — I10 Essential (primary) hypertension: Secondary | ICD-10-CM | POA: Diagnosis not present

## 2017-06-27 DIAGNOSIS — Z1211 Encounter for screening for malignant neoplasm of colon: Secondary | ICD-10-CM | POA: Diagnosis not present

## 2017-09-06 ENCOUNTER — Encounter: Payer: Self-pay | Admitting: *Deleted

## 2017-09-06 ENCOUNTER — Ambulatory Visit: Payer: Medicare HMO | Admitting: Anesthesiology

## 2017-09-06 ENCOUNTER — Ambulatory Visit
Admission: RE | Admit: 2017-09-06 | Discharge: 2017-09-06 | Disposition: A | Discharge status: Home / Self Care | Payer: Medicare HMO | Source: Ambulatory Visit | Attending: Internal Medicine | Admitting: Internal Medicine

## 2017-09-06 ENCOUNTER — Encounter
Admission: RE | Disposition: A | Discharge status: Home / Self Care | Payer: Self-pay | Source: Ambulatory Visit | Attending: Internal Medicine

## 2017-09-06 DIAGNOSIS — D122 Benign neoplasm of ascending colon: Secondary | ICD-10-CM | POA: Diagnosis not present

## 2017-09-06 DIAGNOSIS — K635 Polyp of colon: Secondary | ICD-10-CM | POA: Diagnosis not present

## 2017-09-06 DIAGNOSIS — Z1211 Encounter for screening for malignant neoplasm of colon: Secondary | ICD-10-CM | POA: Insufficient documentation

## 2017-09-06 DIAGNOSIS — M109 Gout, unspecified: Secondary | ICD-10-CM | POA: Insufficient documentation

## 2017-09-06 DIAGNOSIS — E119 Type 2 diabetes mellitus without complications: Secondary | ICD-10-CM | POA: Insufficient documentation

## 2017-09-06 DIAGNOSIS — I10 Essential (primary) hypertension: Secondary | ICD-10-CM | POA: Insufficient documentation

## 2017-09-06 DIAGNOSIS — D126 Benign neoplasm of colon, unspecified: Secondary | ICD-10-CM | POA: Diagnosis not present

## 2017-09-06 DIAGNOSIS — M8580 Other specified disorders of bone density and structure, unspecified site: Secondary | ICD-10-CM | POA: Diagnosis not present

## 2017-09-06 DIAGNOSIS — K219 Gastro-esophageal reflux disease without esophagitis: Secondary | ICD-10-CM | POA: Insufficient documentation

## 2017-09-06 DIAGNOSIS — Z6833 Body mass index (BMI) 33.0-33.9, adult: Secondary | ICD-10-CM | POA: Insufficient documentation

## 2017-09-06 DIAGNOSIS — Z79899 Other long term (current) drug therapy: Secondary | ICD-10-CM | POA: Insufficient documentation

## 2017-09-06 DIAGNOSIS — N393 Stress incontinence (female) (male): Secondary | ICD-10-CM | POA: Insufficient documentation

## 2017-09-06 DIAGNOSIS — R001 Bradycardia, unspecified: Secondary | ICD-10-CM | POA: Insufficient documentation

## 2017-09-06 DIAGNOSIS — Z888 Allergy status to other drugs, medicaments and biological substances status: Secondary | ICD-10-CM | POA: Diagnosis not present

## 2017-09-06 DIAGNOSIS — J45909 Unspecified asthma, uncomplicated: Secondary | ICD-10-CM | POA: Diagnosis not present

## 2017-09-06 DIAGNOSIS — F419 Anxiety disorder, unspecified: Secondary | ICD-10-CM | POA: Insufficient documentation

## 2017-09-06 DIAGNOSIS — F329 Major depressive disorder, single episode, unspecified: Secondary | ICD-10-CM | POA: Diagnosis not present

## 2017-09-06 DIAGNOSIS — E669 Obesity, unspecified: Secondary | ICD-10-CM | POA: Diagnosis not present

## 2017-09-06 DIAGNOSIS — E785 Hyperlipidemia, unspecified: Secondary | ICD-10-CM | POA: Diagnosis not present

## 2017-09-06 DIAGNOSIS — Z7982 Long term (current) use of aspirin: Secondary | ICD-10-CM | POA: Insufficient documentation

## 2017-09-06 HISTORY — PX: COLONOSCOPY: SHX5424

## 2017-09-06 LAB — GLUCOSE, CAPILLARY: Glucose-Capillary: 111 mg/dL — ABNORMAL HIGH (ref 65–99)

## 2017-09-06 SURGERY — COLONOSCOPY
Anesthesia: General

## 2017-09-06 MED ORDER — PROPOFOL 500 MG/50ML IV EMUL
INTRAVENOUS | Status: AC
  Filled 2017-09-06: qty 50

## 2017-09-06 MED ORDER — PROPOFOL 500 MG/50ML IV EMUL
INTRAVENOUS | Status: DC | PRN
  Administered 2017-09-06: 150 ug/kg/min via INTRAVENOUS

## 2017-09-06 MED ORDER — SODIUM CHLORIDE 0.9 % IV SOLN
INTRAVENOUS | Status: DC
  Administered 2017-09-06: 11:00:00 via INTRAVENOUS

## 2017-09-06 MED ORDER — PROPOFOL 10 MG/ML IV BOLUS
INTRAVENOUS | Status: DC | PRN
Start: 2017-09-06 — End: 2017-09-06
  Administered 2017-09-06: 80 mg via INTRAVENOUS

## 2017-09-06 MED ORDER — PHENYLEPHRINE HCL 10 MG/ML IJ SOLN
INTRAMUSCULAR | Status: AC
  Filled 2017-09-06: qty 1

## 2017-09-06 MED ORDER — LIDOCAINE HCL (CARDIAC) 20 MG/ML IV SOLN
INTRAVENOUS | Status: DC | PRN
  Administered 2017-09-06: 100 mg via INTRAVENOUS

## 2017-09-06 MED ORDER — PHENYLEPHRINE HCL 10 MG/ML IJ SOLN
INTRAMUSCULAR | Status: DC | PRN
  Administered 2017-09-06: 200 ug via INTRAVENOUS

## 2017-09-06 MED ORDER — LIDOCAINE HCL (PF) 2 % IJ SOLN
INTRAMUSCULAR | Status: AC
  Filled 2017-09-06: qty 10

## 2017-09-06 NOTE — Anesthesia Post-op Follow-up Note (Signed)
Anesthesia QCDR form completed.        

## 2017-09-06 NOTE — Interval H&P Note (Signed)
History and Physical Interval Note:  09/06/2017 12:13 PM  Martha Singleton  has presented today for surgery, with the diagnosis of COLON CANCER SCREENING  The various methods of treatment have been discussed with the patient and family. After consideration of risks, benefits and other options for treatment, the patient has consented to  Procedure(s): COLONOSCOPY (N/A) as a surgical intervention .  The patient's history has been reviewed, patient examined, no change in status, stable for surgery.  I have reviewed the patient's chart and labs.  Questions were answered to the patient's satisfaction.     [place], [place]

## 2017-09-06 NOTE — Op Note (Signed)
[facility] Gastroenterology Patient Name: Martha Singleton Procedure Date: 09/06/2017 11:39 AM MRN: [id number] Account #: [id number] Date of Birth: [birth year] Admit Type: Outpatient Age: 71 Room: [facility] ENDO ROOM 4 Gender: Female Note Status: Finalized Procedure:            Colonoscopy Indications:          Screening for colorectal malignant neoplasm Providers:            Benay Pike. Alice Reichert MD, MD Referring MD:         Leonie Douglas. Doy Hutching, MD (Referring MD) Medicines:            Propofol per Anesthesia Complications:        No immediate complications. Procedure:            Pre-Anesthesia Assessment:                       - The risks and benefits of the procedure and the                        sedation options and risks were discussed with the                        patient. All questions were answered and informed                        consent was obtained.                       - Patient identification and proposed procedure were                        verified prior to the procedure by the nurse. The                        procedure was verified in the procedure room.                       - The risks and benefits of the procedure and the                        sedation options and risks were discussed with the                        patient. All questions were answered and informed                        consent was obtained.                       - Patient identification and proposed procedure were                        verified prior to the procedure by the nurse. The                        procedure was verified in the procedure room.                       - ASA Grade Assessment: III - A patient with severe  systemic disease.                       - After reviewing the risks and benefits, the patient                        was deemed in satisfactory condition to undergo the                        procedure.                       After  obtaining informed consent, the colonoscope was                        passed under direct vision. Throughout the procedure,                        the patient's blood pressure, pulse, and oxygen                        saturations were monitored continuously. The                        Colonoscope was introduced through the anus and                        advanced to the the cecum, identified by appendiceal                        orifice and ileocecal valve. The colonoscopy was                        performed without difficulty. The patient tolerated the                        procedure well. The quality of the bowel preparation                        was good. Findings:      The perianal and digital rectal examinations were normal.      A 4 mm polyp was found in the ascending colon. The polyp was sessile.       The polyp was removed with a jumbo cold forceps. Resection and retrieval       were complete. Impression:           - One 4 mm polyp in the ascending colon, removed with a                        jumbo cold forceps. Resected and retrieved. Recommendation:       - Await pathology results.                       - Repeat colonoscopy for surveillance based on                        pathology results.                       - Return to GI office PRN.                       -  If the pathology report reveals adenomatous tissue,                        then repeat the colonoscopy for surveillance in 5 years. Procedure Code(s):    --- Professional ---                       [phone], Colonoscopy, flexible; with biopsy, single or                        multiple Diagnosis Code(s):    --- Professional ---                       Z12.11, Encounter for screening for malignant neoplasm                        of colon                       D12.2, Benign neoplasm of ascending colon CPT copyright 2016 American Medical Association. All rights reserved. The codes documented in this report are preliminary  and upon coder review may  be revised to meet current compliance requirements. Efrain Sella MD, MD 09/06/2017 12:05:21 PM This report has been signed electronically. Number of Addenda: 0 Note Initiated On: 09/06/2017 11:39 AM Scope Withdrawal Time: 0 hours 9 minutes 0 seconds  Total Procedure Duration: 0 hours 11 minutes 44 seconds       [facility]

## 2017-09-06 NOTE — H&P (Signed)
Outpatient short stay form Pre-procedure 09/06/2017 12:12 PM Teodoro K. Alice Reichert, M.D.  Primary Physician: Fulton Reek, M.D.  Reason for visit:  Colon cancer screening.  History of present illness:  Patient presents for colon cancer screening. Patient denies change in bowel habits, rectal bleeding or weight loss.     Current Facility-Administered Medications:  .  0.9 %  sodium chloride infusion, , Intravenous, Continuous, [place], Benay Pike, MD, Last Rate: 20 mL/hr at 09/06/17 1043  Medications Prior to Admission  Medication Sig Dispense Refill Last Dose  . albuterol (PROVENTIL HFA;VENTOLIN HFA) 108 (90 BASE) MCG/ACT inhaler Inhale 2 puffs into the lungs every 6 (six) hours as needed for wheezing or shortness of breath. Reported on 02/18/2016   Past Week at Unknown time  . amLODipine (NORVASC) 10 MG tablet Take 1 tablet (10 mg total) by mouth daily. 90 tablet 0 09/06/2017 at Unknown time  . atorvastatin (LIPITOR) 20 MG tablet TAKE 1 TABLET AT BEDTIME (PLEASE CALL TO MAKE AN APPOINTMENT) 90 tablet 0 09/06/2017 at Unknown time  . B Complex Vitamins (VITAMIN-B COMPLEX) TABS Take by mouth daily.   Past Week at Unknown time  . Cholecalciferol (VITAMIN D3) 5000 UNITS CAPS Take 5,000 Units by mouth 2 (two) times daily.   Past Week at Unknown time  . CRANBERRY PO Take by mouth daily.   Past Week at Unknown time  . hydrochlorothiazide (HYDRODIURIL) 25 MG tablet Take 25 mg by mouth daily.   09/06/2017 at Unknown time  . omeprazole (PRILOSEC) 20 MG capsule TAKE 1 CAPSULE ONE TIME DAILY AS NEEDED 90 capsule 1 09/06/2017 at Unknown time  . vitamin E 400 UNIT capsule Take by mouth daily.   Past Week at Unknown time  . Acetaminophen 500 MG coapsule every 4 (four) hours as needed.    Taking  . aspirin 81 MG tablet Take 81 mg by mouth 2 (two) times daily.   09/04/2017  . tamsulosin (FLOMAX) 0.4 MG CAPS capsule Take 1 capsule (0.4 mg total) by mouth daily. 30 capsule 3 Taking     Allergies  Allergen  Reactions  . Benazepril Other (See Comments)    laryngospasm  . Epinephrine Other (See Comments)    Patient states was given epinephrine for reaction to bee sting and heart rate was very elevated and "I was all over the bed"  . Hydralazine Other (See Comments)    Chest pain  . Atenolol Other (See Comments)    Patient is unsure of reaction to medication  . Cefdinir Nausea And Vomiting    Omnicef  . Flexeril [Cyclobenzaprine] Other (See Comments)    Dry mouth, urinary issues  . Hydrochlorothiazide Other (See Comments)    Gout     Past Medical History:  Diagnosis Date  . Anxiety   . Asthma   . Depression   . Diabetes mellitus type 2, diet-controlled ([place]) 02/18/16   A1c 6.7  . GERD (gastroesophageal reflux disease)   . Gout   . Hyperlipidemia   . Hypertension   . Lichen sclerosus   . Lumbago   . Obesity   . Osteopenia   . Sinus bradycardia   . Stress incontinence     Review of systems:      Physical Exam  General appearance: alert, cooperative and appears stated age Resp: clear to auscultation bilaterally Cardio: regular rate and rhythm, S1, S2 normal, no murmur, click, rub or gallop GI: soft, non-tender; bowel sounds normal; no masses,  no organomegaly Extremities: extremities normal, atraumatic, no  cyanosis or edema     Planned procedures: Colonoscopy. The patient understands the nature of the planned procedure, indications, risks, alternatives and potential complications including but not limited to bleeding, infection, perforation, damage to internal organs and possible oversedation/side effects from anesthesia. The patient agrees and gives consent to proceed.  Please refer to procedure notes for findings, recommendations and patient disposition/instructions.    Teodoro K. Alice Reichert, M.D. Gastroenterology 09/06/2017  12:12 PM

## 2017-09-06 NOTE — Anesthesia Preprocedure Evaluation (Signed)
Anesthesia Evaluation  Patient identified by MRN, date of birth, ID band Patient awake    Reviewed: Allergy & Precautions, H&P , NPO status , Patient's Chart, lab work & pertinent test results, reviewed documented beta blocker date and time   History of Anesthesia Complications (+) PONV and history of anesthetic complications  Airway Mallampati: III  TM Distance: >3 FB Neck ROM: full    Dental  (+) Caps   Pulmonary neg shortness of breath, asthma , neg sleep apnea, neg COPD, neg recent URI, former smoker,           Cardiovascular Exercise Tolerance: Good hypertension, (-) angina(-) CAD, (-) Past MI, (-) Cardiac Stents and (-) CABG (-) dysrhythmias + Valvular Problems/Murmurs      Neuro/Psych PSYCHIATRIC DISORDERS negative neurological ROS     GI/Hepatic Neg liver ROS, GERD  ,  Endo/Other  diabetes, Well Controlled, Type 2, Oral Hypoglycemic Agents  Renal/GU negative Renal ROS  negative genitourinary   Musculoskeletal   Abdominal   Peds  Hematology negative hematology ROS (+)   Anesthesia Other Findings Past Medical History: No date: Anxiety No date: Asthma No date: Depression 02/18/16: Diabetes mellitus type 2, diet-controlled ([facility])     Comment:  A1c 6.7 No date: GERD (gastroesophageal reflux disease) No date: Gout No date: Hyperlipidemia No date: Hypertension No date: Lichen sclerosus No date: Lumbago No date: Obesity No date: Osteopenia No date: Sinus bradycardia No date: Stress incontinence   Reproductive/Obstetrics negative OB ROS                             Anesthesia Physical Anesthesia Plan  ASA: III  Anesthesia Plan: General   Post-op Pain Management:    Induction: Intravenous  PONV Risk Score and Plan: 4 or greater and Propofol infusion and Treatment may vary due to age or medical condition  Airway Management Planned: Nasal Cannula  Additional Equipment:    Intra-op Plan:   Post-operative Plan:   Informed Consent: I have reviewed the patients History and Physical, chart, labs and discussed the procedure including the risks, benefits and alternatives for the proposed anesthesia with the patient or authorized representative who has indicated his/her understanding and acceptance.   Dental Advisory Given  Plan Discussed with: Anesthesiologist, CRNA and Surgeon  Anesthesia Plan Comments:         Anesthesia Quick Evaluation

## 2017-09-06 NOTE — Anesthesia Postprocedure Evaluation (Signed)
Anesthesia Post Note  Patient: Martha Singleton  Procedure(s) Performed: COLONOSCOPY (N/A )  Patient location during evaluation: Endoscopy Anesthesia Type: General Level of consciousness: awake and alert Pain management: pain level controlled Vital Signs Assessment: post-procedure vital signs reviewed and stable Respiratory status: spontaneous breathing, nonlabored ventilation, respiratory function stable and patient connected to nasal cannula oxygen Cardiovascular status: blood pressure returned to baseline and stable Postop Assessment: no apparent nausea or vomiting Anesthetic complications: no     Last Vitals:  Vitals:   09/06/17 1227 09/06/17 1237  BP: (!) 122/46 132/66  Pulse: 73 76  Resp: 20 14  Temp:    SpO2: 99% 100%    Last Pain:  Vitals:   09/06/17 1207  TempSrc: Tympanic                 Martha Clan

## 2017-09-06 NOTE — Transfer of Care (Signed)
Immediate Anesthesia Transfer of Care Note  Patient: Martha Singleton  Procedure(s) Performed: COLONOSCOPY (N/A )  Patient Location: Endoscopy Unit  Anesthesia Type:General  Level of Consciousness: awake and alert   Airway & Oxygen Therapy: Patient Spontanous Breathing and Patient connected to nasal cannula oxygen  Post-op Assessment: Report given to RN  Post vital signs: Reviewed and stable  Last Vitals:  Vitals:   09/06/17 1025 09/06/17 1207  BP: (!) 155/82 (!) 80/50  Pulse: (!) 49 73  Resp: 14 18  Temp: 37.1 C (!) 36.4 C  SpO2: 99% 100%    Last Pain:  Vitals:   09/06/17 1207  TempSrc: Tympanic         Complications: No apparent anesthesia complications

## 2017-09-07 ENCOUNTER — Encounter: Payer: Self-pay | Admitting: Internal Medicine

## 2017-09-07 LAB — SURGICAL PATHOLOGY

## 2017-10-25 DIAGNOSIS — R635 Abnormal weight gain: Secondary | ICD-10-CM | POA: Diagnosis not present

## 2017-10-25 DIAGNOSIS — I10 Essential (primary) hypertension: Secondary | ICD-10-CM | POA: Diagnosis not present

## 2017-10-25 DIAGNOSIS — E782 Mixed hyperlipidemia: Secondary | ICD-10-CM | POA: Diagnosis not present

## 2017-10-25 DIAGNOSIS — E119 Type 2 diabetes mellitus without complications: Secondary | ICD-10-CM | POA: Diagnosis not present

## 2017-10-25 DIAGNOSIS — Z1231 Encounter for screening mammogram for malignant neoplasm of breast: Secondary | ICD-10-CM | POA: Diagnosis not present

## 2017-10-25 DIAGNOSIS — Z79899 Other long term (current) drug therapy: Secondary | ICD-10-CM | POA: Diagnosis not present

## 2017-10-31 DIAGNOSIS — I10 Essential (primary) hypertension: Secondary | ICD-10-CM | POA: Diagnosis not present

## 2017-10-31 DIAGNOSIS — Z79899 Other long term (current) drug therapy: Secondary | ICD-10-CM | POA: Diagnosis not present

## 2017-10-31 DIAGNOSIS — E782 Mixed hyperlipidemia: Secondary | ICD-10-CM | POA: Diagnosis not present

## 2017-10-31 DIAGNOSIS — E119 Type 2 diabetes mellitus without complications: Secondary | ICD-10-CM | POA: Diagnosis not present

## 2017-11-08 ENCOUNTER — Other Ambulatory Visit: Payer: Self-pay | Admitting: Family Medicine

## 2017-11-08 NOTE — Telephone Encounter (Signed)
See request for refill on Prednisone; per chart this hasn't been ordered since 11/07/16

## 2017-11-09 NOTE — Telephone Encounter (Signed)
See provider message, needs appointment for medication refill.

## 2017-11-09 NOTE — Telephone Encounter (Signed)
Needs appt

## 2017-11-10 NOTE — Telephone Encounter (Signed)
Spoke with pt and she stated that she thought the med request was going to Dr Doy Hutching. She stated that she would call [facility] to request the medication.   Routing to close.

## 2017-11-17 ENCOUNTER — Telehealth: Payer: Self-pay | Admitting: Family Medicine

## 2017-11-17 NOTE — Telephone Encounter (Signed)
Patient needs refill on the following  Prednisone 20mg  tab  [facility]

## 2017-11-17 NOTE — Telephone Encounter (Signed)
No longer a pt at this office.

## 2017-11-24 DIAGNOSIS — M109 Gout, unspecified: Secondary | ICD-10-CM | POA: Diagnosis not present

## 2017-11-24 DIAGNOSIS — R399 Unspecified symptoms and signs involving the genitourinary system: Secondary | ICD-10-CM | POA: Diagnosis not present

## 2018-01-15 ENCOUNTER — Other Ambulatory Visit: Payer: Self-pay | Admitting: Internal Medicine

## 2018-01-15 DIAGNOSIS — N649 Disorder of breast, unspecified: Secondary | ICD-10-CM

## 2018-01-15 DIAGNOSIS — N6001 Solitary cyst of right breast: Secondary | ICD-10-CM

## 2018-01-15 DIAGNOSIS — Z1239 Encounter for other screening for malignant neoplasm of breast: Secondary | ICD-10-CM

## 2018-01-29 ENCOUNTER — Ambulatory Visit
Admission: RE | Admit: 2018-01-29 | Discharge: 2018-01-29 | Disposition: A | Discharge status: Home / Self Care | Payer: Medicare HMO | Source: Ambulatory Visit | Attending: Internal Medicine | Admitting: Internal Medicine

## 2018-01-29 ENCOUNTER — Ambulatory Visit: Payer: Self-pay

## 2018-01-29 DIAGNOSIS — N6001 Solitary cyst of right breast: Secondary | ICD-10-CM

## 2018-01-29 DIAGNOSIS — Z1231 Encounter for screening mammogram for malignant neoplasm of breast: Secondary | ICD-10-CM | POA: Insufficient documentation

## 2018-01-29 DIAGNOSIS — N649 Disorder of breast, unspecified: Secondary | ICD-10-CM

## 2018-01-29 DIAGNOSIS — Z1239 Encounter for other screening for malignant neoplasm of breast: Secondary | ICD-10-CM

## 2018-02-16 ENCOUNTER — Ambulatory Visit
Admission: RE | Admit: 2018-02-16 | Discharge: 2018-02-16 | Disposition: A | Discharge status: Home / Self Care | Payer: Medicare HMO | Source: Ambulatory Visit | Attending: Internal Medicine | Admitting: Internal Medicine

## 2018-02-16 DIAGNOSIS — Z1239 Encounter for other screening for malignant neoplasm of breast: Secondary | ICD-10-CM

## 2018-02-16 DIAGNOSIS — N6313 Unspecified lump in the right breast, lower outer quadrant: Secondary | ICD-10-CM | POA: Insufficient documentation

## 2018-02-16 DIAGNOSIS — N649 Disorder of breast, unspecified: Secondary | ICD-10-CM

## 2018-02-16 DIAGNOSIS — N6311 Unspecified lump in the right breast, upper outer quadrant: Secondary | ICD-10-CM | POA: Diagnosis not present

## 2018-02-16 DIAGNOSIS — N6310 Unspecified lump in the right breast, unspecified quadrant: Secondary | ICD-10-CM | POA: Diagnosis not present

## 2018-02-16 DIAGNOSIS — R928 Other abnormal and inconclusive findings on diagnostic imaging of breast: Secondary | ICD-10-CM | POA: Diagnosis not present

## 2018-02-16 DIAGNOSIS — N6001 Solitary cyst of right breast: Secondary | ICD-10-CM

## 2018-02-19 ENCOUNTER — Other Ambulatory Visit: Payer: Self-pay | Admitting: Internal Medicine

## 2018-02-19 DIAGNOSIS — N6310 Unspecified lump in the right breast, unspecified quadrant: Secondary | ICD-10-CM

## 2018-02-19 DIAGNOSIS — R928 Other abnormal and inconclusive findings on diagnostic imaging of breast: Secondary | ICD-10-CM

## 2018-02-21 HISTORY — PX: BREAST BIOPSY: SHX20

## 2018-03-01 ENCOUNTER — Ambulatory Visit
Admission: RE | Admit: 2018-03-01 | Discharge: 2018-03-01 | Disposition: A | Discharge status: Home / Self Care | Payer: Medicare HMO | Source: Ambulatory Visit | Attending: Internal Medicine | Admitting: Internal Medicine

## 2018-03-01 DIAGNOSIS — N6310 Unspecified lump in the right breast, unspecified quadrant: Secondary | ICD-10-CM

## 2018-03-01 DIAGNOSIS — D241 Benign neoplasm of right breast: Secondary | ICD-10-CM | POA: Diagnosis not present

## 2018-03-01 DIAGNOSIS — N6311 Unspecified lump in the right breast, upper outer quadrant: Secondary | ICD-10-CM | POA: Diagnosis not present

## 2018-03-01 DIAGNOSIS — R928 Other abnormal and inconclusive findings on diagnostic imaging of breast: Secondary | ICD-10-CM

## 2018-03-01 DIAGNOSIS — N6313 Unspecified lump in the right breast, lower outer quadrant: Secondary | ICD-10-CM | POA: Diagnosis not present

## 2018-03-02 LAB — SURGICAL PATHOLOGY

## 2018-03-23 DIAGNOSIS — I38 Endocarditis, valve unspecified: Secondary | ICD-10-CM | POA: Insufficient documentation

## 2018-03-23 DIAGNOSIS — I493 Ventricular premature depolarization: Secondary | ICD-10-CM | POA: Insufficient documentation

## 2018-03-24 DIAGNOSIS — N3090 Cystitis, unspecified without hematuria: Secondary | ICD-10-CM | POA: Diagnosis not present

## 2018-03-24 DIAGNOSIS — R399 Unspecified symptoms and signs involving the genitourinary system: Secondary | ICD-10-CM | POA: Diagnosis not present

## 2018-03-26 ENCOUNTER — Other Ambulatory Visit: Payer: Self-pay | Admitting: Surgery

## 2018-03-26 ENCOUNTER — Encounter: Payer: Self-pay | Admitting: Surgery

## 2018-03-26 ENCOUNTER — Telehealth: Payer: Self-pay

## 2018-03-26 ENCOUNTER — Ambulatory Visit (INDEPENDENT_AMBULATORY_CARE_PROVIDER_SITE_OTHER): Payer: Medicare HMO | Admitting: Surgery

## 2018-03-26 VITALS — BP 143/88 | HR 80 | Temp 97.7°F | Ht 59.0 in | Wt 162.2 lb

## 2018-03-26 DIAGNOSIS — D241 Benign neoplasm of right breast: Secondary | ICD-10-CM | POA: Diagnosis not present

## 2018-03-26 DIAGNOSIS — D369 Benign neoplasm, unspecified site: Secondary | ICD-10-CM | POA: Diagnosis not present

## 2018-03-26 NOTE — Addendum Note (Signed)
Addended by: Caroleen Hamman F on: 03/26/2018 04:38 PM   Modules accepted: Orders

## 2018-03-26 NOTE — Progress Notes (Signed)
Surgical Consultation  03/26/2018  Martha Singleton is an 72 y.o. female.   Chief Complaint  Patient presents with  . New Patient (Initial Visit)    Fibroadenoma / Papilloma Right breast     HPI: Martha Singleton is seen as rotation of the request of Martha Singleton.  SHe recently underwent routine mammogram that prompted ultrasound ( images personally reviewed) and ultrasound-guided core needle biopsy of 2 undetermined lesions on the right breast. One located at 9:00 3 cm from the nipple and the other one located 9:00 7 cm from the nipple. First lesion pathology consistent with fibroadenoma and the second lesion consistent with intraductal papilloma. Denies any symptoms.  No nipple discharge.  No masses or lumps.  No retraction of the nipple or skin changes. He was for several years on Premarin.  No previous breast biopsies.  No previous breast surgeries. Does have a Martha Singleton history is significant for breast cancer in a maternal aunt and 2 cousins. She is able to perform more than 4 METS of activity without any shortness of breath or chest pain. She had a prior cholecystectomy and abdominal hysterectomy. She Does have a history of A. fib but is not anticoagulated CBC and CMP within normal ranges except the glucose.  Past Medical History:  Diagnosis Date  . Anxiety   . Asthma   . Depression   . Diabetes mellitus type 2, diet-controlled ([place]) 02/18/16   A1c 6.7  . GERD (gastroesophageal reflux disease)   . Gout   . Hyperlipidemia   . Hypertension   . Lichen sclerosus   . Lumbago   . Obesity   . Osteopenia   . Sinus bradycardia   . Stress incontinence     Past Surgical History:  Procedure Laterality Date  . ABDOMINAL HYSTERECTOMY     total  . CHOLECYSTECTOMY    . COLONOSCOPY N/A 09/06/2017   Procedure: COLONOSCOPY;  Surgeon: Martha Singleton, Martha Singleton;  Location: [facility] ENDOSCOPY;  Service: Gastroenterology;  Laterality: N/A;  . EYE SURGERY     Cataract  . INCONTINENCE SURGERY       Family History  Problem Relation Age of Onset  . Heart disease Mother   . Heart disease Father   . Heart disease Brother   . Hypertension Son   . CAD Maternal Grandfather   . Heart disease Brother   . Hypertension Son   . Breast cancer Maternal Aunt 60  . Breast cancer Cousin 28       2 mat cousins    Social History:  reports that she quit smoking about 17 years ago. She has never used smokeless tobacco. She reports that she drinks alcohol. She reports that she does not use drugs.  Allergies:  Allergies  Allergen Reactions  . Benazepril Other (See Comments)    laryngospasm  . Epinephrine Other (See Comments)    Patient states was given epinephrine for reaction to bee sting and heart rate was very elevated and "I was all over the bed"  . Hydralazine Other (See Comments)    Chest pain  . Atenolol Other (See Comments)    Patient is unsure of reaction to medication  . Cefdinir Nausea And Vomiting    Omnicef  . Flexeril [Cyclobenzaprine] Other (See Comments)    Dry mouth, urinary issues  . Hydrochlorothiazide Other (See Comments)    Gout    Medications reviewed.     ROS Full ROS performed and is otherwise negative other than what is stated in the  HPI    BP (!) 143/88   Pulse 80   Temp 97.7 F (36.5 C) (Oral)   Ht 4\' 11"  (1.499 m)   Wt 73.6 kg (162 lb 3.2 oz)   BMI 32.76 kg/m   Physical Exam  Constitutional: She is oriented to person, place, and time. She appears well-developed and well-nourished.  Eyes: Conjunctivae and EOM are normal. Right eye exhibits no discharge. Left eye exhibits no discharge. No scleral icterus.  Neck: Normal range of motion. Neck supple. No JVD present. No tracheal deviation present. No thyromegaly present.  Cardiovascular: Normal rate, regular rhythm and normal heart sounds.  Pulmonary/Chest: Effort normal and breath sounds normal. No stridor. No respiratory distress. She has no wheezes. She has no rales. She exhibits no  tenderness.  Bilateral breast, no skin lesion, no palpable masses, nml nipples. Normal bil axillas.  Abdominal: Soft. Bowel sounds are normal.  Musculoskeletal: Normal range of motion. She exhibits no edema.  Neurological: She is alert and oriented to person, place, and time. She displays normal reflexes. No cranial nerve deficit. She exhibits normal muscle tone. Coordination normal.  Skin: Skin is warm and dry. Capillary refill takes less than 2 seconds.  Psychiatric: She has a normal mood and affect. Her behavior is normal. Judgment and thought content normal.  Nursing note and vitals reviewed.   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 Discussed with the patient detail about her disease process and the current recommendation of excisional biopsy given the fact that these lesions may harbor DCIS and may actually be upgraded. We will plan for needle lock lumpectomy.  Discussed with the patient detail with the procedure, risk benefit and possible complications including but not limited to: Bleeding, reexcision, pain, breast deformity, bleeding and infection.  She understands and wishes to proceed.  Extensive counseling provided  Martha Hamman, Singleton FACS General Surgeon dermatitis

## 2018-03-26 NOTE — H&P (View-Only) (Signed)
Surgical Consultation  03/26/2018  Martha Singleton is an 72 y.o. female.   Chief Complaint  Patient presents with  . New Patient (Initial Visit)    Fibroadenoma / Papilloma Right breast     HPI: Martha Singleton is seen as rotation of the request of Dr. Joneen Caraway.  SHe recently underwent routine mammogram that prompted ultrasound ( images personally reviewed) and ultrasound-guided core needle biopsy of 2 undetermined lesions on the right breast. One located at 9:00 3 cm from the nipple and the other one located 9:00 7 cm from the nipple. First lesion pathology consistent with fibroadenoma and the second lesion consistent with intraductal papilloma. Denies any symptoms.  No nipple discharge.  No masses or lumps.  No retraction of the nipple or skin changes. He was for several years on Premarin.  No previous breast biopsies.  No previous breast surgeries. Does have a Martha Singleton history is significant for breast cancer in a maternal aunt and 2 cousins. She is able to perform more than 4 METS of activity without any shortness of breath or chest pain. She had a prior cholecystectomy and abdominal hysterectomy. She Does have a history of A. fib but is not anticoagulated CBC and CMP within normal ranges except the glucose.  Past Medical History:  Diagnosis Date  . Anxiety   . Asthma   . Depression   . Diabetes mellitus type 2, diet-controlled ([place]) 02/18/16   A1c 6.7  . GERD (gastroesophageal reflux disease)   . Gout   . Hyperlipidemia   . Hypertension   . Lichen sclerosus   . Lumbago   . Obesity   . Osteopenia   . Sinus bradycardia   . Stress incontinence     Past Surgical History:  Procedure Laterality Date  . ABDOMINAL HYSTERECTOMY     total  . CHOLECYSTECTOMY    . COLONOSCOPY N/A 09/06/2017   Procedure: COLONOSCOPY;  Surgeon: Toledo, Benay Pike, MD;  Location: [facility] ENDOSCOPY;  Service: Gastroenterology;  Laterality: N/A;  . EYE SURGERY     Cataract  . INCONTINENCE SURGERY       Family History  Problem Relation Age of Onset  . Heart disease Mother   . Heart disease Father   . Heart disease Brother   . Hypertension Son   . CAD Maternal Grandfather   . Heart disease Brother   . Hypertension Son   . Breast cancer Maternal Aunt 60  . Breast cancer Cousin 30       2 mat cousins    Social History:  reports that she quit smoking about 17 years ago. She has never used smokeless tobacco. She reports that she drinks alcohol. She reports that she does not use drugs.  Allergies:  Allergies  Allergen Reactions  . Benazepril Other (See Comments)    laryngospasm  . Epinephrine Other (See Comments)    Patient states was given epinephrine for reaction to bee sting and heart rate was very elevated and "I was all over the bed"  . Hydralazine Other (See Comments)    Chest pain  . Atenolol Other (See Comments)    Patient is unsure of reaction to medication  . Cefdinir Nausea And Vomiting    Omnicef  . Flexeril [Cyclobenzaprine] Other (See Comments)    Dry mouth, urinary issues  . Hydrochlorothiazide Other (See Comments)    Gout    Medications reviewed.     ROS Full ROS performed and is otherwise negative other than what is stated in the  HPI    BP (!) 143/88   Pulse 80   Temp 97.7 F (36.5 C) (Oral)   Ht 4\' 11"  (1.499 m)   Wt 73.6 kg (162 lb 3.2 oz)   BMI 32.76 kg/m   Physical Exam  Constitutional: She is oriented to person, place, and time. She appears well-developed and well-nourished.  Eyes: Conjunctivae and EOM are normal. Right eye exhibits no discharge. Left eye exhibits no discharge. No scleral icterus.  Neck: Normal range of motion. Neck supple. No JVD present. No tracheal deviation present. No thyromegaly present.  Cardiovascular: Normal rate, regular rhythm and normal heart sounds.  Pulmonary/Chest: Effort normal and breath sounds normal. No stridor. No respiratory distress. She has no wheezes. She has no rales. She exhibits no  tenderness.  Bilateral breast, no skin lesion, no palpable masses, nml nipples. Normal bil axillas.  Abdominal: Soft. Bowel sounds are normal.  Musculoskeletal: Normal range of motion. She exhibits no edema.  Neurological: She is alert and oriented to person, place, and time. She displays normal reflexes. No cranial nerve deficit. She exhibits normal muscle tone. Coordination normal.  Skin: Skin is warm and dry. Capillary refill takes less than 2 seconds.  Psychiatric: She has a normal mood and affect. Her behavior is normal. Judgment and thought content normal.  Nursing note and vitals reviewed.   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 Discussed with the patient detail about her disease process and the current recommendation of excisional biopsy given the fact that these lesions may harbor DCIS and may actually be upgraded. We will plan for needle lock lumpectomy.  Discussed with the patient detail with the procedure, risk benefit and possible complications including but not limited to: Bleeding, reexcision, pain, breast deformity, bleeding and infection.  She understands and wishes to proceed.  Extensive counseling provided  Caroleen Hamman, MD FACS General Surgeon dermatitis

## 2018-03-26 NOTE — Patient Instructions (Addendum)
Please see your blue pre-care sheet for surgery information.    We have spoken today about removing a lump in your breast. This will be done on 04/02/18 by Dr. Caroleen Hamman at [facility].  You will most likely be able to leave the hospital several hours after your surgery. Rarely, a patient needs to stay over night but this is a possibility.  Plan to tenatively be off work for 1-2 weeks following the surgery and may return with approximately 4 more weeks of a lifting restriction, no greater than 15 lbs.    Lumpectomy A lumpectomy is a form of "breast conserving" or "breast preservation" surgery. It may also be referred to as a partial mastectomy. During a lumpectomy, the portion of the breast that contains the cancerous tumor or breast mass (the lump) is removed. Some normal tissue around the lump may also be removed to make sure all of the tumor has been removed.  LET [facility] CARE PROVIDER KNOW ABOUT:  Any allergies you have.  All medicines you are taking, including vitamins, herbs, eye drops, creams, and over-the-counter medicines.  Previous problems you or members of your family have had with the use of anesthetics.  Any blood disorders you have.  Previous surgeries you have had.  Medical conditions you have. RISKS AND COMPLICATIONS Generally, this is a safe procedure. However, problems can occur and include:  Bleeding.  Infection.  Pain.  Temporary swelling.  Change in the shape of the breast, particularly if a large portion is removed. BEFORE THE PROCEDURE  Ask your health care provider about changing or stopping your regular medicines. This is especially important if you are taking diabetes medicines or blood thinners.  Do not eat or drink anything after midnight on the night before the procedure or as directed by your health care provider. Ask your health care provider if you can take a sip of water with any approved medicines.  On the day of surgery, your health care  provider will use a mammogram or ultrasound to locate and mark the tumor in your breast. These markings on your breast will show where the cut (incision) will be made. PROCEDURE   An IV tube will be put into one of your veins.  You may be given medicine to help you relax before the surgery (sedative). You will be given one of the following:  A medicine that numbs the area (local anesthetic).  A medicine that makes you fall asleep (general anesthetic).  Your health care provider will use a kind of electric scalpel that uses heat to minimize bleeding (electrocautery knife).  A curved incision (like a smile or frown) that follows the natural curve of your breast is made, to allow for minimal scarring and better healing.  The tumor will be removed with some of the surrounding tissue. This will be sent to the lab for analysis. Your health care provider may also remove your lymph nodes at this time if needed.  Sometimes, but not always, a rubber tube called a drain will be surgically inserted into your breast area or armpit to collect excess fluid that may accumulate in the space where the tumor was. This drain is connected to a plastic bulb on the outside of your body. This drain creates suction to help remove the fluid.  The incisions will be closed with stitches (sutures).  A bandage may be placed over the incisions. AFTER THE PROCEDURE  You will be taken to the recovery area.  You will be given  medicine for pain.  A small rubber drain may be placed in the breast for 2-3 days to prevent a collection of blood (hematoma) from developing in the breast. You will be given instructions on caring for the drain before you go home.  A pressure bandage (dressing) will be applied for 1-2 days to prevent bleeding. Ask your health care provider how to care for your bandage at home.   This information is not intended to replace advice given to you by your health care provider. Make sure you discuss  any questions you have with your health care provider.   Document Released: 11/21/2006 Document Revised: 10/31/2014 Document Reviewed: 03/15/2013 Elsevier Interactive Patient Education [employer].

## 2018-03-26 NOTE — Telephone Encounter (Signed)
Anticoagulant and Cardiac Clearance faxed to [facility] at this time.   Medical Clearance faxed to Dr.Sparks at this time.

## 2018-03-28 ENCOUNTER — Telehealth: Payer: Self-pay | Admitting: Surgery

## 2018-03-28 NOTE — Telephone Encounter (Signed)
Pt advised of pre op date/time and sx date. Sx: 04/03/18 with Dr Dahlia Byes Right breast lumpectomy with NL. Pre op: 03/29/18 @ 10:00pm--office interview.   Patient made aware to arrive at [facility] at 9:15am the day of surgery.

## 2018-03-29 ENCOUNTER — Encounter
Admission: RE | Admit: 2018-03-29 | Discharge: 2018-03-29 | Disposition: A | Discharge status: Home / Self Care | Payer: Medicare HMO | Source: Ambulatory Visit | Attending: Surgery | Admitting: Surgery

## 2018-03-29 ENCOUNTER — Other Ambulatory Visit: Payer: Self-pay

## 2018-03-29 DIAGNOSIS — I10 Essential (primary) hypertension: Secondary | ICD-10-CM | POA: Diagnosis not present

## 2018-03-29 DIAGNOSIS — Z01810 Encounter for preprocedural cardiovascular examination: Secondary | ICD-10-CM | POA: Insufficient documentation

## 2018-03-29 DIAGNOSIS — Z01812 Encounter for preprocedural laboratory examination: Secondary | ICD-10-CM | POA: Diagnosis not present

## 2018-03-29 DIAGNOSIS — I447 Left bundle-branch block, unspecified: Secondary | ICD-10-CM | POA: Insufficient documentation

## 2018-03-29 HISTORY — DX: Dyspnea, unspecified: R06.00

## 2018-03-29 HISTORY — DX: Adverse effect of unspecified anesthetic, initial encounter: T41.45XA

## 2018-03-29 HISTORY — DX: Cardiac arrhythmia, unspecified: I49.9

## 2018-03-29 HISTORY — DX: Other complications of anesthesia, initial encounter: T88.59XA

## 2018-03-29 LAB — CBC
HCT: 41.2 % (ref 35.0–47.0)
HEMOGLOBIN: 14.2 g/dL (ref 12.0–16.0)
MCH: 30.6 pg (ref 26.0–34.0)
MCHC: 34.5 g/dL (ref 32.0–36.0)
MCV: 88.7 fL (ref 80.0–100.0)
PLATELETS: 310 10*3/uL (ref 150–440)
RBC: 4.64 MIL/uL (ref 3.80–5.20)
RDW: 13.9 % (ref 11.5–14.5)
WBC: 7.8 10*3/uL (ref 3.6–11.0)

## 2018-03-29 LAB — BASIC METABOLIC PANEL
Anion gap: 8 (ref 5–15)
BUN: 18 mg/dL (ref 6–20)
CHLORIDE: 105 mmol/L (ref 101–111)
CO2: 25 mmol/L (ref 22–32)
CREATININE: 0.89 mg/dL (ref 0.44–1.00)
Calcium: 9.1 mg/dL (ref 8.9–10.3)
Glucose, Bld: 279 mg/dL — ABNORMAL HIGH (ref 65–99)
POTASSIUM: 3.6 mmol/L (ref 3.5–5.1)
Sodium: 138 mmol/L (ref 135–145)

## 2018-03-29 MED ORDER — CHLORHEXIDINE GLUCONATE CLOTH 2 % EX PADS
6.0000 | MEDICATED_PAD | Freq: Once | CUTANEOUS | Status: DC
  Filled 2018-03-29: qty 6

## 2018-03-29 NOTE — Patient Instructions (Signed)
Your procedure is scheduled on: 04/03/18 Tues for needle placement at 9:15 am Report to Same Day Surgery 2nd floor medical mall [facility] Entrance-take elevator on left to 2nd floor.  Check in with surgery information desk.)   Remember: Instructions that are not followed completely may result in serious medical risk, up to and including death, or upon the discretion of your surgeon and anesthesiologist your surgery may need to be rescheduled.    _x___ 1. Do not eat food after midnight the night before your procedure. You may drink clear liquids up to 2 hours before you are scheduled to arrive at the hospital for your procedure.  Do not drink clear liquids within 2 hours of your scheduled arrival to the hospital.  Clear liquids include  --Water or Apple juice without pulp  --Clear carbohydrate beverage such as ClearFast or Gatorade  --Black Coffee or Clear Tea (No milk, no creamers, do not add anything to                  the coffee or Tea Type 1 and type 2 diabetics should only drink water.  No gum chewing or hard candies.     __x__ 2. No Alcohol for 24 hours before or after surgery.   __x__3. No Smoking or e-cigarettes for 24 prior to surgery.  Do not use any chewable tobacco products for at least 6 hour prior to surgery   ____  4. Bring all medications with you on the day of surgery if instructed.    __x__ 5. Notify your doctor if there is any change in your medical condition     (cold, fever, infections).    x___6. On the morning of surgery brush your teeth with toothpaste and water.  You may rinse your mouth with mouth wash if you wish.  Do not swallow any toothpaste or mouthwash.   Do not wear jewelry, make-up, hairpins, clips or nail polish.  Do not wear lotions, powders, or perfumes. You may wear deodorant.  Do not shave 48 hours prior to surgery. Men may shave face and neck.  Do not bring valuables to the hospital.    [facility] is not responsible for any belongings or  valuables.               Contacts, dentures or bridgework may not be worn into surgery.  Leave your suitcase in the car. After surgery it may be brought to your room.  For patients admitted to the hospital, discharge time is determined by your                       treatment team.  _  Patients discharged the day of surgery will not be allowed to drive home.  You will need someone to drive you home and stay with you the night of your procedure.    Please read over the following fact sheets that you were given:   [facility] Preparing for Surgery and or MRSA Information   _x___ Take anti-hypertensive listed below, cardiac, seizure, asthma,     anti-reflux and psychiatric medicines. These include:  1. albuterol (PROVENTIL HFA;VENTOLIN HFA) 108 (90 BASE) MCG/ACT inhaler  2.amLODipine (NORVASC) 10 MG tablet  3.atorvastatin (LIPITOR) 20 MG tablet  4.omeprazole (PRILOSEC) 20 MG capsule  5.telmisartan (MICARDIS) 80 MG tablet  6.  ____Fleets enema or Magnesium Citrate as directed.   _x___ Use CHG Soap or sage wipes as directed on instruction sheet   ____ Use  inhalers on the day of surgery and bring to hospital day of surgery  ____ Stop Metformin and Janumet 2 days prior to surgery.    ____ Take 1/2 of usual insulin dose the night before surgery and none on the morning     surgery.   _x___ Follow recommendations from Cardiologist, Pulmonologist or PCP regarding          stopping Aspirin, Coumadin, Plavix ,Eliquis, Effient, or Pradaxa, and Pletal.  X____Stop Anti-inflammatories such as Advil, Aleve, Ibuprofen, Motrin, Naproxen, Naprosyn, Goodies powders or aspirin products. OK to take Tylenol and                          Celebrex. Stop ibuprofen today.   _x___ Stop supplements until after surgery.  But may continue Vitamin D, Vitamin B,       and multivitamin.   Stop vitamin e today until after surgery ____ Bring C-Pap to the hospital.

## 2018-03-29 NOTE — Pre-Procedure Instructions (Signed)
PATIENT WITH KNOWN LBBB

## 2018-04-02 MED ORDER — CLINDAMYCIN PHOSPHATE 900 MG/50ML IV SOLN
900.0000 mg | INTRAVENOUS | Status: AC
  Administered 2018-04-03: 900 mg via INTRAVENOUS

## 2018-04-03 ENCOUNTER — Encounter
Admission: RE | Disposition: A | Discharge status: Home / Self Care | Payer: Self-pay | Source: Ambulatory Visit | Attending: Surgery

## 2018-04-03 ENCOUNTER — Encounter: Payer: Self-pay | Admitting: *Deleted

## 2018-04-03 ENCOUNTER — Ambulatory Visit
Admission: RE | Admit: 2018-04-03 | Discharge: 2018-04-03 | Disposition: A | Discharge status: Home / Self Care | Payer: Medicare HMO | Source: Ambulatory Visit | Attending: Surgery | Admitting: Surgery

## 2018-04-03 ENCOUNTER — Other Ambulatory Visit: Payer: Self-pay

## 2018-04-03 ENCOUNTER — Ambulatory Visit: Payer: Medicare HMO | Admitting: Certified Registered"

## 2018-04-03 DIAGNOSIS — K219 Gastro-esophageal reflux disease without esophagitis: Secondary | ICD-10-CM | POA: Diagnosis not present

## 2018-04-03 DIAGNOSIS — E119 Type 2 diabetes mellitus without complications: Secondary | ICD-10-CM | POA: Insufficient documentation

## 2018-04-03 DIAGNOSIS — N6011 Diffuse cystic mastopathy of right breast: Secondary | ICD-10-CM | POA: Diagnosis not present

## 2018-04-03 DIAGNOSIS — Z888 Allergy status to other drugs, medicaments and biological substances status: Secondary | ICD-10-CM | POA: Diagnosis not present

## 2018-04-03 DIAGNOSIS — Z7982 Long term (current) use of aspirin: Secondary | ICD-10-CM | POA: Diagnosis not present

## 2018-04-03 DIAGNOSIS — F329 Major depressive disorder, single episode, unspecified: Secondary | ICD-10-CM | POA: Insufficient documentation

## 2018-04-03 DIAGNOSIS — Z87891 Personal history of nicotine dependence: Secondary | ICD-10-CM | POA: Diagnosis not present

## 2018-04-03 DIAGNOSIS — Z6832 Body mass index (BMI) 32.0-32.9, adult: Secondary | ICD-10-CM | POA: Diagnosis not present

## 2018-04-03 DIAGNOSIS — D369 Benign neoplasm, unspecified site: Secondary | ICD-10-CM | POA: Diagnosis not present

## 2018-04-03 DIAGNOSIS — E669 Obesity, unspecified: Secondary | ICD-10-CM | POA: Insufficient documentation

## 2018-04-03 DIAGNOSIS — F418 Other specified anxiety disorders: Secondary | ICD-10-CM | POA: Diagnosis not present

## 2018-04-03 DIAGNOSIS — D241 Benign neoplasm of right breast: Secondary | ICD-10-CM | POA: Insufficient documentation

## 2018-04-03 DIAGNOSIS — J45909 Unspecified asthma, uncomplicated: Secondary | ICD-10-CM | POA: Insufficient documentation

## 2018-04-03 DIAGNOSIS — Z803 Family history of malignant neoplasm of breast: Secondary | ICD-10-CM | POA: Insufficient documentation

## 2018-04-03 DIAGNOSIS — E785 Hyperlipidemia, unspecified: Secondary | ICD-10-CM | POA: Diagnosis not present

## 2018-04-03 DIAGNOSIS — I10 Essential (primary) hypertension: Secondary | ICD-10-CM | POA: Insufficient documentation

## 2018-04-03 DIAGNOSIS — Z79899 Other long term (current) drug therapy: Secondary | ICD-10-CM | POA: Insufficient documentation

## 2018-04-03 DIAGNOSIS — Z7984 Long term (current) use of oral hypoglycemic drugs: Secondary | ICD-10-CM | POA: Insufficient documentation

## 2018-04-03 HISTORY — PX: BREAST LUMPECTOMY: SHX2

## 2018-04-03 HISTORY — PX: BREAST LUMPECTOMY WITH NEEDLE LOCALIZATION: SHX5759

## 2018-04-03 LAB — GLUCOSE, CAPILLARY
GLUCOSE-CAPILLARY: 280 mg/dL — AB (ref 65–99)
GLUCOSE-CAPILLARY: 284 mg/dL — AB (ref 65–99)
Glucose-Capillary: 202 mg/dL — ABNORMAL HIGH (ref 65–99)
Glucose-Capillary: 262 mg/dL — ABNORMAL HIGH (ref 65–99)

## 2018-04-03 SURGERY — BREAST LUMPECTOMY WITH NEEDLE LOCALIZATION
Anesthesia: General | Laterality: Right

## 2018-04-03 MED ORDER — HYDROCODONE-ACETAMINOPHEN 5-325 MG PO TABS: 1.0000 | ORAL_TABLET | ORAL | 0 refills | Status: AC | PRN

## 2018-04-03 MED ORDER — SUCCINYLCHOLINE CHLORIDE 20 MG/ML IJ SOLN
INTRAMUSCULAR | Status: AC
  Filled 2018-04-03: qty 1

## 2018-04-03 MED ORDER — LIDOCAINE-EPINEPHRINE 1 %-1:100000 IJ SOLN
INTRAMUSCULAR | Status: AC
  Filled 2018-04-03: qty 1

## 2018-04-03 MED ORDER — SUGAMMADEX SODIUM 200 MG/2ML IV SOLN
INTRAVENOUS | Status: AC
  Filled 2018-04-03: qty 2

## 2018-04-03 MED ORDER — ONDANSETRON HCL 4 MG/2ML IJ SOLN
4.0000 mg | Freq: Once | INTRAMUSCULAR | Status: AC | PRN
  Administered 2018-04-03: 4 mg via INTRAVENOUS

## 2018-04-03 MED ORDER — SUGAMMADEX SODIUM 200 MG/2ML IV SOLN
INTRAVENOUS | Status: DC | PRN
  Administered 2018-04-03: 150 mg via INTRAVENOUS

## 2018-04-03 MED ORDER — EPHEDRINE SULFATE 50 MG/ML IJ SOLN
INTRAMUSCULAR | Status: DC | PRN
  Administered 2018-04-03: 10 mg via INTRAVENOUS

## 2018-04-03 MED ORDER — SODIUM CHLORIDE 0.9 % IV SOLN
INTRAVENOUS | Status: DC | PRN
  Administered 2018-04-03: 30 ug/min via INTRAVENOUS

## 2018-04-03 MED ORDER — HYDROCODONE-ACETAMINOPHEN 5-325 MG PO TABS
ORAL_TABLET | ORAL | Status: AC
  Filled 2018-04-03: qty 1

## 2018-04-03 MED ORDER — FENTANYL CITRATE (PF) 100 MCG/2ML IJ SOLN
INTRAMUSCULAR | Status: AC
  Administered 2018-04-03: 25 ug via INTRAVENOUS
  Filled 2018-04-03: qty 2

## 2018-04-03 MED ORDER — LIDOCAINE HCL (CARDIAC) PF 100 MG/5ML IV SOSY
PREFILLED_SYRINGE | INTRAVENOUS | Status: DC | PRN
  Administered 2018-04-03: 80 mg via INTRAVENOUS

## 2018-04-03 MED ORDER — ONDANSETRON HCL 4 MG/2ML IJ SOLN
INTRAMUSCULAR | Status: AC
  Filled 2018-04-03: qty 2

## 2018-04-03 MED ORDER — PROPOFOL 10 MG/ML IV BOLUS
INTRAVENOUS | Status: AC
  Filled 2018-04-03: qty 20

## 2018-04-03 MED ORDER — INSULIN ASPART 100 UNIT/ML ~~LOC~~ SOLN
SUBCUTANEOUS | Status: AC
  Filled 2018-04-03: qty 1

## 2018-04-03 MED ORDER — INSULIN ASPART 100 UNIT/ML ~~LOC~~ SOLN
3.0000 [IU] | Freq: Once | SUBCUTANEOUS | Status: AC
  Administered 2018-04-03: 3 [IU] via SUBCUTANEOUS

## 2018-04-03 MED ORDER — CLINDAMYCIN PHOSPHATE 900 MG/50ML IV SOLN
INTRAVENOUS | Status: AC
  Filled 2018-04-03: qty 50

## 2018-04-03 MED ORDER — FENTANYL CITRATE (PF) 100 MCG/2ML IJ SOLN
INTRAMUSCULAR | Status: DC | PRN
  Administered 2018-04-03: 50 ug via INTRAVENOUS
  Administered 2018-04-03: 75 ug via INTRAVENOUS
  Administered 2018-04-03: 25 ug via INTRAVENOUS

## 2018-04-03 MED ORDER — LIDOCAINE HCL (PF) 2 % IJ SOLN
INTRAMUSCULAR | Status: AC
  Filled 2018-04-03: qty 10

## 2018-04-03 MED ORDER — ACETAMINOPHEN 10 MG/ML IV SOLN
INTRAVENOUS | Status: DC | PRN
  Administered 2018-04-03: 1000 mg via INTRAVENOUS

## 2018-04-03 MED ORDER — ISOSULFAN BLUE 1 % ~~LOC~~ SOLN
SUBCUTANEOUS | Status: AC
  Filled 2018-04-03: qty 5

## 2018-04-03 MED ORDER — GLIMEPIRIDE 2 MG PO TABS
2.0000 mg | ORAL_TABLET | Freq: Once | ORAL | Status: AC
  Administered 2018-04-03: 2 mg via ORAL
  Filled 2018-04-03: qty 1

## 2018-04-03 MED ORDER — PROPOFOL 10 MG/ML IV BOLUS
INTRAVENOUS | Status: DC | PRN
  Administered 2018-04-03: 40 mg via INTRAVENOUS
  Administered 2018-04-03: 110 mg via INTRAVENOUS

## 2018-04-03 MED ORDER — BUPIVACAINE-EPINEPHRINE (PF) 0.25% -1:200000 IJ SOLN
INTRAMUSCULAR | Status: DC | PRN
  Administered 2018-04-03: 30 mL

## 2018-04-03 MED ORDER — ROCURONIUM BROMIDE 100 MG/10ML IV SOLN
INTRAVENOUS | Status: DC | PRN
  Administered 2018-04-03: 10 mg via INTRAVENOUS
  Administered 2018-04-03: 20 mg via INTRAVENOUS

## 2018-04-03 MED ORDER — FENTANYL CITRATE (PF) 100 MCG/2ML IJ SOLN
INTRAMUSCULAR | Status: AC
  Filled 2018-04-03: qty 2

## 2018-04-03 MED ORDER — HYDROCODONE-ACETAMINOPHEN 5-325 MG PO TABS: 1.0000 | ORAL_TABLET | ORAL | Status: DC | PRN

## 2018-04-03 MED ORDER — ACETAMINOPHEN 10 MG/ML IV SOLN
INTRAVENOUS | Status: AC
  Filled 2018-04-03: qty 100

## 2018-04-03 MED ORDER — SODIUM CHLORIDE 0.9 % IV SOLN
INTRAVENOUS | Status: DC
  Administered 2018-04-03: 11:00:00 via INTRAVENOUS

## 2018-04-03 MED ORDER — BUPIVACAINE-EPINEPHRINE (PF) 0.25% -1:200000 IJ SOLN
INTRAMUSCULAR | Status: AC
  Filled 2018-04-03: qty 30

## 2018-04-03 MED ORDER — PHENYLEPHRINE HCL 10 MG/ML IJ SOLN
INTRAMUSCULAR | Status: DC | PRN
  Administered 2018-04-03 (×4): 100 ug via INTRAVENOUS

## 2018-04-03 MED ORDER — SUCCINYLCHOLINE CHLORIDE 20 MG/ML IJ SOLN
INTRAMUSCULAR | Status: DC | PRN
  Administered 2018-04-03: 100 mg via INTRAVENOUS

## 2018-04-03 MED ORDER — EPHEDRINE SULFATE 50 MG/ML IJ SOLN
INTRAMUSCULAR | Status: AC
  Filled 2018-04-03: qty 1

## 2018-04-03 MED ORDER — INSULIN ASPART 100 UNIT/ML ~~LOC~~ SOLN
SUBCUTANEOUS | Status: AC
  Administered 2018-04-03: 3 [IU] via SUBCUTANEOUS
  Filled 2018-04-03: qty 1

## 2018-04-03 MED ORDER — FENTANYL CITRATE (PF) 100 MCG/2ML IJ SOLN
25.0000 ug | INTRAMUSCULAR | Status: DC | PRN
  Administered 2018-04-03 (×2): 25 ug via INTRAVENOUS

## 2018-04-03 SURGICAL SUPPLY — 28 items
APPLIER CLIP 9.375 SM OPEN (CLIP)
CANISTER SUCT 1200ML W/VALVE (MISCELLANEOUS) ×3 IMPLANT
CHLORAPREP W/TINT 26ML (MISCELLANEOUS) ×3 IMPLANT
CLIP APPLIE 9.375 SM OPEN (CLIP) IMPLANT
DERMABOND ADVANCED (GAUZE/BANDAGES/DRESSINGS) ×2
DERMABOND ADVANCED .7 DNX12 (GAUZE/BANDAGES/DRESSINGS) ×1 IMPLANT
DRAPE CHEST BREAST 77X106 FENE (MISCELLANEOUS) ×3 IMPLANT
ELECT CAUTERY BLADE 6.4 (BLADE) ×3 IMPLANT
ELECT REM PT RETURN 9FT ADLT (ELECTROSURGICAL) ×3
ELECTRODE REM PT RTRN 9FT ADLT (ELECTROSURGICAL) ×1 IMPLANT
GLOVE BIO SURGEON STRL SZ7 (GLOVE) ×9 IMPLANT
GLOVE INDICATOR 7.5 STRL GRN (GLOVE) ×9 IMPLANT
GOWN STRL REUS W/ TWL LRG LVL3 (GOWN DISPOSABLE) ×2 IMPLANT
GOWN STRL REUS W/TWL LRG LVL3 (GOWN DISPOSABLE) ×4
KIT TURNOVER KIT A (KITS) ×3 IMPLANT
MARGIN MAP 10MM (MISCELLANEOUS) ×3 IMPLANT
NEEDLE HYPO 22GX1.5 SAFETY (NEEDLE) ×3 IMPLANT
PACK BASIN MINOR ARMC (MISCELLANEOUS) ×3 IMPLANT
SPONGE LAP 18X18 RF (DISPOSABLE) ×3 IMPLANT
SUT MNCRL 4-0 (SUTURE) ×2
SUT MNCRL 4-0 27XMFL (SUTURE) ×1
SUT SILK 2 0 SH (SUTURE) ×3 IMPLANT
SUT VIC AB 2-0 SH 27 (SUTURE) ×2
SUT VIC AB 2-0 SH 27XBRD (SUTURE) ×1 IMPLANT
SUT VIC AB 3-0 SH 27 (SUTURE) ×4
SUT VIC AB 3-0 SH 27X BRD (SUTURE) ×2 IMPLANT
SUTURE MNCRL 4-0 27XMF (SUTURE) ×1 IMPLANT
WATER STERILE IRR 1000ML POUR (IV SOLUTION) ×3 IMPLANT

## 2018-04-03 NOTE — Discharge Instructions (Addendum)
Lumpectomy, Care After This sheet gives you information about how to care for yourself after your procedure. Your health care provider may also give you more specific instructions. If you have problems or questions, contact your health care provider. What can I expect after the procedure? After the procedure, it is common to have:  Breast swelling.  Breast tenderness.  Stiffness in your arm or shoulder.  A change in the shape and feel of your breast.  Scar tissue that feels hard to the touch in the area where the lump was removed.  Follow these instructions at home: Bathing  Take sponge baths until your health care provider says that you can start showering or bathing.  Do not take baths, swim, or use a hot tub until your health care provider approves. Incision care  Follow instructions from your health care provider about how to take care of your incision. Make sure you: ? Wash your hands with soap and water before you change your bandage (dressing). If soap and water are not available, use hand sanitizer. ? Change your dressing as told by your health care provider. ? Leave stitches (sutures), skin glue, or adhesive strips in place. These skin closures may need to stay in place for 2 weeks or longer. If adhesive strip edges start to loosen and curl up, you may trim the loose edges. Do not remove adhesive strips completely unless your health care provider tells you to do that.  Check your incision area every day for signs of infection. Check for: ? More redness, swelling, or pain. ? More fluid or blood. ? Warmth. ? Pus or a bad smell.  Keep your dressing clean and dry.    If you were sent home with a surgical drain in place, follow instructions from your health care provider about emptying it. Activity  Return to your normal activities as told by your health care provider. Ask your health care provider what activities are safe for you.  Avoid activities that require a lot  of energy (are strenuous).  Be careful to avoid any activities that could cause an injury to your arm on the side of your surgery.  Do not lift anything that is heavier than 10 lb (4.5 kg). Avoid lifting with the arm that is on the side of your surgery.  Do not carry heavy objects on your shoulder.  After your drain is removed, you should perform exercises to keep your arm from getting stiff and swollen. Talk with your health care provider about which exercises are safe for you. General instructions  Take over-the-counter and prescription medicines only as told by your health care provider.  You may eat what you usually do.  Wear a supportive bra as told by your health care provider.  Raise (elevate) your arm above the level of your heart while you are sitting or lying down.  Do not wear tight jewelry on your arm, wrist, or fingers on the side of your surgery. Follow-up  Keep all follow-up visits as told by your health care provider. This is important.  You may need to be screened for extra fluid around the lymph nodes (lymphedema). Follow instructions from your health care provider about how often you should be checked.  If you had any lymph nodes removed during your procedure, be sure to tell all of your health care providers. This is important information to share before you are involved in certain procedures, such as giving blood or having your blood pressure taken. Contact a  health care provider if:  You develop a rash.  You have a fever.  Your pain medicine is not working.  Your swelling, weakness, or numbness in your arm has not improved after a few weeks.  You have new swelling in your breast or arm.  You have more redness, swelling, or pain in your incision area.  You have more fluid or blood coming from your incision.  Your incision feels warm to the touch.  You have pus or a bad smell coming from your incision. Get help right away if:  You have very bad pain  in your breast or arm.  You have chest pain.  You have difficulty breathing. This information is not intended to replace advice given to you by your health care provider. Make sure you discuss any questions you have with your health care provider. Document Released: 10/26/2006 Document Revised: 06/22/2016 Document Reviewed: 06/22/2016 Elsevier Interactive Patient Education  2018 [place]   1) The drugs that you were given will stay in your system until tomorrow so for the next 24 hours you should not:  A) Drive an automobile B) Make any legal decisions C) Drink any alcoholic beverage   2) You may resume regular meals tomorrow.  Today it is better to start with liquids and gradually work up to solid foods.  You may eat anything you prefer, but it is better to start with liquids, then soup and crackers, and gradually work up to solid foods.   3) Please notify your doctor immediately if you have any unusual bleeding, trouble breathing, redness and pain at the surgery site, drainage, fever, or pain not relieved by medication.    4) Additional Instructions:        Please contact your physician with any problems or Same Day Surgery at [phone], Monday through Friday 6 am to 4 pm, or [place] at [facility] number at [phone].

## 2018-04-03 NOTE — Transfer of Care (Signed)
Immediate Anesthesia Transfer of Care Note  Patient: Martha Singleton  Procedure(s) Performed: BREAST LUMPECTOMY WITH NEEDLE LOCALIZATION (Right )  Patient Location: PACU  Anesthesia Type:General  Level of Consciousness: awake and responds to stimulation  Airway & Oxygen Therapy: Patient Spontanous Breathing and Patient connected to face mask oxygen  Post-op Assessment: Report given to RN and Post -op Vital signs reviewed and stable  Post vital signs: Reviewed and stable  Last Vitals:  Vitals Value Taken Time  BP 140/52 04/03/2018  2:08 PM  Temp    Pulse 91 04/03/2018  2:08 PM  Resp 12 04/03/2018  2:08 PM  SpO2 97 % 04/03/2018  2:08 PM    Last Pain:  Vitals:   04/03/18 1034  TempSrc: Oral         Complications: No apparent anesthesia complications

## 2018-04-03 NOTE — OR Nursing (Signed)
Discussed discharge instructions with pt and daughter. Both voice understanding. 

## 2018-04-03 NOTE — Op Note (Signed)
  Pre-operative Diagnosis: Right Breast Intraductal papiloma  Post-operative Diagnosis: Same   Surgeon: Caroleen Hamman, MD FACS  Anesthesia: GETA  Procedure: Right Partial mastectomy , needle directed and with Ultrasound guidance.   Findings: Specimen xray shows clip and wire within the excised tissue  Estimated Blood Loss: Minimal         Drains: None         Specimens: partial mastectomy with labels       Complications: none                  Condition: Stable    Procedure Details  The patient was seen again in the Holding Room. The benefits, complications, treatment options, and expected outcomes were discussed with the patient. The risks of bleeding, infection, recurrence of symptoms, failure to resolve symptoms, hematoma, seroma, open wound, cosmetic deformity, and the need for further surgery were discussed.  The patient was taken to Operating Room, identified as Martha Singleton and the procedure verified.  A Time Out was held and the above information confirmed.  Prior to the induction of general anesthesia, antibiotic prophylaxis was administered. VTE prophylaxis was in place. Appropriate anesthesia was then administered and tolerated well. The chest was prepped with Chloraprep and draped in the sterile fashion. The patient was positioned in the supine position.   Attention was turned to the needle localization site where an incision was made  After locating the tip of the needle with the Ultrasound probe . Dissection around the needle to perform a partial mastectomy with adequate margins was performed, our deep margin was the pectoralis and we had at least 1.5 cms of good margins circumferentially.. This was done with electrocautery and sharp dissection. Hemostasis was with electrocautery. Specimen removed and marked for margins.   Additional Marcaine was infiltrated into the skin and subcutaneous tissues of the cavity. Once assuring that hemostasis was adequate and checked  multiple times the wound was closed with interrupted 3-0 Vicryl followed by 4-0 subcuticular Monocryl sutures. Dermabond was applied. Patient was taken to the recovery room in stable condition.   Caroleen Hamman, MD, FACS

## 2018-04-03 NOTE — Anesthesia Postprocedure Evaluation (Signed)
Anesthesia Post Note  Patient: Martha Singleton  Procedure(s) Performed: BREAST LUMPECTOMY WITH NEEDLE LOCALIZATION (Right )  Patient location during evaluation: PACU Anesthesia Type: General Level of consciousness: awake and alert and oriented Pain management: pain level controlled Vital Signs Assessment: post-procedure vital signs reviewed and stable Respiratory status: spontaneous breathing Cardiovascular status: blood pressure returned to baseline Anesthetic complications: no     Last Vitals:  Vitals:   04/03/18 1544 04/03/18 1602  BP: (!) 134/52 (!) 131/58  Pulse: 80   Resp:    Temp: 36.6 C   SpO2: 93% 95%    Last Pain:  Vitals:   04/03/18 1602  TempSrc:   PainSc: 3                  Alex Leahy

## 2018-04-03 NOTE — Anesthesia Procedure Notes (Signed)
Procedure Name: Intubation Performed by: Lance Muss, CRNA Pre-anesthesia Checklist: Patient identified, Patient being monitored, Timeout performed, Emergency Drugs available and Suction available Patient Re-evaluated:Patient Re-evaluated prior to induction Oxygen Delivery Method: Circle system utilized Preoxygenation: Pre-oxygenation with 100% oxygen Induction Type: IV induction Ventilation: Mask ventilation without difficulty Laryngoscope Size: 3 and McGraph Grade View: Grade I Tube type: Oral Tube size: 7.0 mm Number of attempts: 1 Airway Equipment and Method: Stylet Placement Confirmation: ETT inserted through vocal cords under direct vision,  positive ETCO2 and breath sounds checked- equal and bilateral Secured at: 21 cm Tube secured with: Tape Dental Injury: Teeth and Oropharynx as per pre-operative assessment  Difficulty Due To: Difficult Airway- due to limited oral opening Future Recommendations: Recommend- induction with short-acting agent, and alternative techniques readily available

## 2018-04-03 NOTE — Progress Notes (Signed)
Pt CBG now 280. Dr. Kayleen Memos notified. Stated that due to pt taking Amaryl and having had the 6 units of Novolog, pt is ok to go home. Jenson Beedle E 3:36 PM 04/03/2018

## 2018-04-03 NOTE — Anesthesia Post-op Follow-up Note (Signed)
Anesthesia QCDR form completed.        

## 2018-04-03 NOTE — Interval H&P Note (Signed)
History and Physical Interval Note:  04/03/2018 11:35 AM  Martha Singleton  has presented today for surgery, with the diagnosis of intraductal papilloma  The various methods of treatment have been discussed with the patient and family. After consideration of risks, benefits and other options for treatment, the patient has consented to  Procedure(s): BREAST LUMPECTOMY WITH NEEDLE LOCALIZATION (Right) as a surgical intervention .  The patient's history has been reviewed, patient examined, no change in status, stable for surgery.  I have reviewed the patient's chart and labs.  Questions were answered to the patient's satisfaction.     K[place]le

## 2018-04-03 NOTE — Anesthesia Preprocedure Evaluation (Addendum)
Anesthesia Evaluation  Patient identified by MRN, date of birth, ID band Patient awake    Reviewed: Allergy & Precautions, H&P , NPO status , Patient's Chart, lab work & pertinent test results, reviewed documented beta blocker date and time   History of Anesthesia Complications (+) PONV and history of anesthetic complications  Airway Mallampati: III  TM Distance: <3 FB Neck ROM: full    Dental  (+) Caps, Chipped   Pulmonary shortness of breath and with exertion, asthma , neg sleep apnea, neg COPD, neg recent URI, former smoker,    Pulmonary exam normal        Cardiovascular Exercise Tolerance: Good hypertension, (-) angina(-) CAD, (-) Past MI, (-) Cardiac Stents and (-) CABG Normal cardiovascular exam(-) dysrhythmias + Valvular Problems/Murmurs      Neuro/Psych PSYCHIATRIC DISORDERS negative neurological ROS     GI/Hepatic Neg liver ROS, GERD  ,  Endo/Other  diabetes, Well Controlled, Type 2, Oral Hypoglycemic Agents  Renal/GU negative Renal ROS  negative genitourinary   Musculoskeletal   Abdominal Normal abdominal exam  (+)   Peds  Hematology negative hematology ROS (+)   Anesthesia Other Findings Past Medical History: No date: Anxiety No date: Asthma No date: Depression 02/18/16: Diabetes mellitus type 2, diet-controlled (HCC)     Comment:  A1c 6.7 No date: GERD (gastroesophageal reflux disease) No date: Gout No date: Hyperlipidemia No date: Hypertension No date: Lichen sclerosus No date: Lumbago No date: Obesity No date: Osteopenia No date: Sinus bradycardia No date: Stress incontinence   Reproductive/Obstetrics negative OB ROS                            Anesthesia Physical  Anesthesia Plan  ASA: III  Anesthesia Plan: General   Post-op Pain Management:    Induction: Intravenous  PONV Risk Score and Plan: 4 or greater and Propofol infusion and Treatment may vary due to  age or medical condition  Airway Management Planned: Oral ETT  Additional Equipment:   Intra-op Plan:   Post-operative Plan: Extubation in OR  Informed Consent: I have reviewed the patients History and Physical, chart, labs and discussed the procedure including the risks, benefits and alternatives for the proposed anesthesia with the patient or authorized representative who has indicated his/her understanding and acceptance.   Dental Advisory Given  Plan Discussed with: Anesthesiologist, CRNA and Surgeon  Anesthesia Plan Comments:        Anesthesia Quick Evaluation

## 2018-04-04 ENCOUNTER — Encounter: Payer: Self-pay | Admitting: Surgery

## 2018-04-04 LAB — SURGICAL PATHOLOGY

## 2018-04-09 ENCOUNTER — Telehealth: Payer: Self-pay | Admitting: Surgery

## 2018-04-09 NOTE — Telephone Encounter (Signed)
Patient notified of pathology results. Patient instructed to keep follow up appointment.

## 2018-04-09 NOTE — Telephone Encounter (Signed)
Patient is calling said she is in some pain, pain level being a 7 or 8. Patient is asking for some pain medication. Patient is also asking is it possible to get blood clots, said when she bends over it hurts.patient can be reached at [phone]. Please call patient and advise.

## 2018-04-09 NOTE — Telephone Encounter (Signed)
Patient has called and would like to see If her pathology results are back from her lumpectomy. I informed patient that Dr Dahlia Byes would go over pathology at the post op visit. She states she did not want to wait that long to know of results. She requested a nurse to call her back.

## 2018-04-09 NOTE — Telephone Encounter (Signed)
Patient states that she is out of the Tylenol 3 medication. We discussed alternating Ibuprofen and Tylenol and she is ok with doing this.   She denies increase in pain, fever, or any redness around the incision area. She stated when she bends over or is up moving around she notices discomfort of the breast more. She was instructed to continue to wear the bra for support.  We discussed her follow up 04/20/18 with Dr.Pabon and to call if she needs anything before then.

## 2018-04-11 ENCOUNTER — Telehealth: Payer: Self-pay

## 2018-04-11 NOTE — Telephone Encounter (Signed)
Patient called stating that she had called last week complaining of the same symptoms as before. She stated that she continues to have pain on her right breast incision even though she had taken Ibuprofen and tylenol. She also stated that the incision site feels warm at touch and feels that she has a low grade fever for about two days. I told patient that I would prefer for her to see one of our surgeons to make sure that she is not developing an infection. Patient understood and agreed with my decision. She will come in tomorrow at 1:30 PM.

## 2018-04-12 ENCOUNTER — Encounter: Payer: Self-pay | Admitting: Surgery

## 2018-04-13 ENCOUNTER — Ambulatory Visit (INDEPENDENT_AMBULATORY_CARE_PROVIDER_SITE_OTHER): Payer: Medicare HMO | Admitting: Surgery

## 2018-04-13 ENCOUNTER — Ambulatory Visit
Admission: RE | Admit: 2018-04-13 | Discharge: 2018-04-13 | Disposition: A | Discharge status: Home / Self Care | Payer: Medicare HMO | Source: Ambulatory Visit | Attending: Surgery | Admitting: Surgery

## 2018-04-13 ENCOUNTER — Other Ambulatory Visit: Payer: Self-pay | Admitting: Surgery

## 2018-04-13 ENCOUNTER — Encounter: Payer: Self-pay | Admitting: Surgery

## 2018-04-13 DIAGNOSIS — D369 Benign neoplasm, unspecified site: Secondary | ICD-10-CM

## 2018-04-13 DIAGNOSIS — N6489 Other specified disorders of breast: Secondary | ICD-10-CM

## 2018-04-13 DIAGNOSIS — M549 Dorsalgia, unspecified: Secondary | ICD-10-CM | POA: Diagnosis not present

## 2018-04-13 DIAGNOSIS — R110 Nausea: Secondary | ICD-10-CM | POA: Diagnosis not present

## 2018-04-13 DIAGNOSIS — Z09 Encounter for follow-up examination after completed treatment for conditions other than malignant neoplasm: Secondary | ICD-10-CM

## 2018-04-13 DIAGNOSIS — R339 Retention of urine, unspecified: Secondary | ICD-10-CM | POA: Diagnosis not present

## 2018-04-13 MED ORDER — SULFAMETHOXAZOLE-TRIMETHOPRIM 800-160 MG PO TABS: 1.0000 | ORAL_TABLET | Freq: Two times a day (BID) | ORAL | 0 refills | Status: DC

## 2018-04-13 NOTE — Progress Notes (Signed)
04/13/2018  HPI: Patient is s/p right breast wire localized excisional biopsy for intraductal papilloma by Dr. Dahlia Byes on 6/11.  Patient reports that more recently over the past few days she has noticed that her right breast tenderness was getting worse and that she has had low grade temperatures at home.  Last night she was more concerned that she had a temperature of 100.3.  She has been taking tylenol every 4-6 hours for pain.  She reports that 4 days ago she felt that something had "popped" inside her right breast and that she felt as if fluid was draining inside.  There was no external drainage at that point.  This morning she woke up with some bloody drainage on her bra.  She has noted some warmth to touch of the breast and some redness of the skin.  Vital signs: There were no vitals taken for this visit.   Physical Exam: Constitutional: No acute distress Breast:  Right breast latera incision appears clean and dry, without any wound breakdown or dehiscence. There is mild erythema medial to the incision extending from the incision towards the medial aspect of the nipple/areola.  On palpation, there is tenderness at the incision and also where the erythema is.  No particular fluctuance is palpated, and there is minimal induration. No drainage noted on exam.  The left breast is intact without any changes, erythema, or tenderness.  Assessment/Plan: 72 yo female s/p right breast wire-localized excisional biopsy.  -discussed with the patient that given her tenderness, some erythema, and low grade temperature, she could have an infected seroma or hematoma.  As a precaution, will start her on Bactrim and will have her go to the breast center for an ultrasound and possible fluid aspiration if any suspicious findings.  The patient reports that she has been wearing her own bra, and I recommended that she get a sports bra for better support or compression, or perhaps one from the breast center if they have any  available.  The main goal is to provide some compression.  She will follow up with Dr. Dahlia Byes next week to assess her progress.    Melvyn Neth, [facility]

## 2018-04-13 NOTE — Patient Instructions (Addendum)
Please pick your medicine at the pharmacy today and begin taking it.  We have scheduled an Ultrasound today of your right breast.   Please go to [facility] now to have that done.   Please see your follow up appointment listed below.

## 2018-04-19 ENCOUNTER — Encounter: Payer: Self-pay | Admitting: Surgery

## 2018-04-19 ENCOUNTER — Ambulatory Visit (INDEPENDENT_AMBULATORY_CARE_PROVIDER_SITE_OTHER): Payer: Medicare HMO | Admitting: Surgery

## 2018-04-19 VITALS — BP 157/85 | HR 77 | Temp 97.5°F | Ht 59.0 in | Wt 159.0 lb

## 2018-04-19 DIAGNOSIS — Z09 Encounter for follow-up examination after completed treatment for conditions other than malignant neoplasm: Secondary | ICD-10-CM

## 2018-04-19 MED ORDER — GABAPENTIN 100 MG PO CAPS: 100.0000 mg | ORAL_CAPSULE | Freq: Three times a day (TID) | ORAL | 0 refills | Status: AC

## 2018-04-19 NOTE — Patient Instructions (Signed)
Please pick up your medication at the pharmacy and begin taking today.  Please call if you have questions or concerns.

## 2018-04-19 NOTE — Progress Notes (Signed)
S/p Right lumpectomy Path c/w intraductal papilloma no atypica or malignancy Clear margins She was recently given antibiotics but only took them for 2 days . Apparently developed " hypoglycemia" She feels better some ocasional " shooting pains" NO fevers Path d/w pt in detail  PE NAD Wound healing well, no infection no seroma  A/P Doing well Resolved seroma No complications May add gabapentin as adjuvant med RTC prn

## 2018-04-20 DIAGNOSIS — R0602 Shortness of breath: Secondary | ICD-10-CM | POA: Diagnosis not present

## 2018-04-20 DIAGNOSIS — J9801 Acute bronchospasm: Secondary | ICD-10-CM | POA: Diagnosis not present

## 2018-05-07 DIAGNOSIS — I119 Hypertensive heart disease without heart failure: Secondary | ICD-10-CM | POA: Diagnosis not present

## 2018-05-07 DIAGNOSIS — E119 Type 2 diabetes mellitus without complications: Secondary | ICD-10-CM | POA: Diagnosis not present

## 2018-05-07 DIAGNOSIS — N183 Chronic kidney disease, stage 3 (moderate): Secondary | ICD-10-CM | POA: Diagnosis not present

## 2018-05-07 DIAGNOSIS — I10 Essential (primary) hypertension: Secondary | ICD-10-CM | POA: Diagnosis not present

## 2018-05-10 DIAGNOSIS — N183 Chronic kidney disease, stage 3 (moderate): Secondary | ICD-10-CM | POA: Diagnosis not present

## 2018-05-10 DIAGNOSIS — I10 Essential (primary) hypertension: Secondary | ICD-10-CM | POA: Diagnosis not present

## 2018-05-10 DIAGNOSIS — Z79899 Other long term (current) drug therapy: Secondary | ICD-10-CM | POA: Diagnosis not present

## 2018-05-10 DIAGNOSIS — E782 Mixed hyperlipidemia: Secondary | ICD-10-CM | POA: Diagnosis not present

## 2018-05-10 DIAGNOSIS — E119 Type 2 diabetes mellitus without complications: Secondary | ICD-10-CM | POA: Diagnosis not present

## 2018-05-16 DIAGNOSIS — E119 Type 2 diabetes mellitus without complications: Secondary | ICD-10-CM | POA: Diagnosis not present

## 2018-05-16 DIAGNOSIS — J40 Bronchitis, not specified as acute or chronic: Secondary | ICD-10-CM | POA: Diagnosis not present

## 2018-05-22 ENCOUNTER — Ambulatory Visit: Payer: Medicare HMO

## 2018-05-22 ENCOUNTER — Other Ambulatory Visit: Payer: Self-pay | Admitting: Physician Assistant

## 2018-05-22 DIAGNOSIS — M79605 Pain in left leg: Secondary | ICD-10-CM

## 2018-05-22 DIAGNOSIS — N183 Chronic kidney disease, stage 3 (moderate): Secondary | ICD-10-CM | POA: Diagnosis not present

## 2018-05-22 DIAGNOSIS — E119 Type 2 diabetes mellitus without complications: Secondary | ICD-10-CM | POA: Diagnosis not present

## 2018-05-28 ENCOUNTER — Other Ambulatory Visit: Payer: Self-pay | Admitting: Physician Assistant

## 2018-05-28 DIAGNOSIS — R609 Edema, unspecified: Secondary | ICD-10-CM

## 2018-05-28 DIAGNOSIS — M79605 Pain in left leg: Secondary | ICD-10-CM

## 2018-05-30 ENCOUNTER — Ambulatory Visit
Admission: RE | Admit: 2018-05-30 | Discharge: 2018-05-30 | Disposition: A | Discharge status: Home / Self Care | Payer: Medicare HMO | Source: Ambulatory Visit | Attending: Physician Assistant | Admitting: Physician Assistant

## 2018-05-30 DIAGNOSIS — M79605 Pain in left leg: Secondary | ICD-10-CM

## 2018-05-30 DIAGNOSIS — M7989 Other specified soft tissue disorders: Secondary | ICD-10-CM | POA: Diagnosis not present

## 2018-05-30 DIAGNOSIS — R609 Edema, unspecified: Secondary | ICD-10-CM

## 2018-05-30 DIAGNOSIS — M79662 Pain in left lower leg: Secondary | ICD-10-CM | POA: Diagnosis not present

## 2018-06-01 DIAGNOSIS — R21 Rash and other nonspecific skin eruption: Secondary | ICD-10-CM | POA: Diagnosis not present

## 2018-06-01 DIAGNOSIS — M545 Low back pain: Secondary | ICD-10-CM | POA: Diagnosis not present

## 2018-06-01 DIAGNOSIS — M5442 Lumbago with sciatica, left side: Secondary | ICD-10-CM | POA: Diagnosis not present

## 2018-07-23 DIAGNOSIS — M25562 Pain in left knee: Secondary | ICD-10-CM | POA: Diagnosis not present

## 2018-07-23 DIAGNOSIS — M5442 Lumbago with sciatica, left side: Secondary | ICD-10-CM | POA: Diagnosis not present

## 2018-08-17 DIAGNOSIS — R319 Hematuria, unspecified: Secondary | ICD-10-CM | POA: Diagnosis not present

## 2018-08-17 DIAGNOSIS — N183 Chronic kidney disease, stage 3 (moderate): Secondary | ICD-10-CM | POA: Diagnosis not present

## 2018-08-17 DIAGNOSIS — N760 Acute vaginitis: Secondary | ICD-10-CM | POA: Diagnosis not present

## 2018-08-17 DIAGNOSIS — I10 Essential (primary) hypertension: Secondary | ICD-10-CM | POA: Diagnosis not present

## 2018-08-17 DIAGNOSIS — E119 Type 2 diabetes mellitus without complications: Secondary | ICD-10-CM | POA: Diagnosis not present

## 2018-08-17 DIAGNOSIS — E782 Mixed hyperlipidemia: Secondary | ICD-10-CM | POA: Diagnosis not present

## 2018-08-22 DIAGNOSIS — R319 Hematuria, unspecified: Secondary | ICD-10-CM | POA: Diagnosis not present

## 2018-08-30 DIAGNOSIS — E119 Type 2 diabetes mellitus without complications: Secondary | ICD-10-CM | POA: Diagnosis not present

## 2018-10-09 DIAGNOSIS — H9193 Unspecified hearing loss, bilateral: Secondary | ICD-10-CM | POA: Diagnosis not present

## 2018-10-09 DIAGNOSIS — Z0000 Encounter for general adult medical examination without abnormal findings: Secondary | ICD-10-CM | POA: Diagnosis not present

## 2018-10-09 DIAGNOSIS — E782 Mixed hyperlipidemia: Secondary | ICD-10-CM | POA: Diagnosis not present

## 2018-10-09 DIAGNOSIS — N183 Chronic kidney disease, stage 3 (moderate): Secondary | ICD-10-CM | POA: Diagnosis not present

## 2018-10-09 DIAGNOSIS — M79605 Pain in left leg: Secondary | ICD-10-CM | POA: Diagnosis not present

## 2018-10-09 DIAGNOSIS — E119 Type 2 diabetes mellitus without complications: Secondary | ICD-10-CM | POA: Diagnosis not present

## 2018-10-09 DIAGNOSIS — I10 Essential (primary) hypertension: Secondary | ICD-10-CM | POA: Diagnosis not present

## 2018-10-09 DIAGNOSIS — M25562 Pain in left knee: Secondary | ICD-10-CM | POA: Diagnosis not present

## 2019-03-21 ENCOUNTER — Other Ambulatory Visit: Payer: Self-pay | Admitting: Neurosurgery

## 2019-08-13 ENCOUNTER — Other Ambulatory Visit: Payer: Self-pay | Admitting: Internal Medicine

## 2019-08-13 DIAGNOSIS — Z1231 Encounter for screening mammogram for malignant neoplasm of breast: Secondary | ICD-10-CM

## 2020-10-06 ENCOUNTER — Ambulatory Visit: Payer: Medicare HMO | Admitting: Dermatology

## 2020-10-29 DIAGNOSIS — I6523 Occlusion and stenosis of bilateral carotid arteries: Secondary | ICD-10-CM | POA: Diagnosis not present

## 2020-10-29 DIAGNOSIS — I10 Essential (primary) hypertension: Secondary | ICD-10-CM | POA: Diagnosis not present

## 2020-10-29 DIAGNOSIS — I493 Ventricular premature depolarization: Secondary | ICD-10-CM | POA: Diagnosis not present

## 2020-10-29 DIAGNOSIS — E782 Mixed hyperlipidemia: Secondary | ICD-10-CM | POA: Diagnosis not present

## 2020-10-29 DIAGNOSIS — E1151 Type 2 diabetes mellitus with diabetic peripheral angiopathy without gangrene: Secondary | ICD-10-CM | POA: Diagnosis not present

## 2020-10-29 DIAGNOSIS — I119 Hypertensive heart disease without heart failure: Secondary | ICD-10-CM | POA: Diagnosis not present

## 2020-10-29 DIAGNOSIS — Z794 Long term (current) use of insulin: Secondary | ICD-10-CM | POA: Diagnosis not present

## 2020-12-03 DIAGNOSIS — M25572 Pain in left ankle and joints of left foot: Secondary | ICD-10-CM | POA: Diagnosis not present

## 2020-12-03 DIAGNOSIS — E118 Type 2 diabetes mellitus with unspecified complications: Secondary | ICD-10-CM | POA: Diagnosis not present

## 2020-12-21 DIAGNOSIS — E782 Mixed hyperlipidemia: Secondary | ICD-10-CM | POA: Diagnosis not present

## 2020-12-21 DIAGNOSIS — Z0189 Encounter for other specified special examinations: Secondary | ICD-10-CM | POA: Diagnosis not present

## 2020-12-21 DIAGNOSIS — Z1159 Encounter for screening for other viral diseases: Secondary | ICD-10-CM | POA: Diagnosis not present

## 2020-12-21 DIAGNOSIS — E118 Type 2 diabetes mellitus with unspecified complications: Secondary | ICD-10-CM | POA: Diagnosis not present

## 2020-12-21 DIAGNOSIS — I10 Essential (primary) hypertension: Secondary | ICD-10-CM | POA: Diagnosis not present

## 2020-12-21 DIAGNOSIS — Z79899 Other long term (current) drug therapy: Secondary | ICD-10-CM | POA: Diagnosis not present

## 2021-01-08 DIAGNOSIS — Z0100 Encounter for examination of eyes and vision without abnormal findings: Secondary | ICD-10-CM | POA: Diagnosis not present

## 2021-01-08 DIAGNOSIS — H2640 Unspecified secondary cataract: Secondary | ICD-10-CM | POA: Diagnosis not present

## 2021-01-12 ENCOUNTER — Ambulatory Visit: Payer: Medicare HMO | Admitting: Dermatology

## 2021-01-12 ENCOUNTER — Other Ambulatory Visit: Payer: Self-pay

## 2021-01-12 DIAGNOSIS — L821 Other seborrheic keratosis: Secondary | ICD-10-CM

## 2021-01-12 DIAGNOSIS — L409 Psoriasis, unspecified: Secondary | ICD-10-CM

## 2021-01-12 DIAGNOSIS — D235 Other benign neoplasm of skin of trunk: Secondary | ICD-10-CM

## 2021-01-12 DIAGNOSIS — D239 Other benign neoplasm of skin, unspecified: Secondary | ICD-10-CM

## 2021-01-12 MED ORDER — FLUOCINOLONE ACETONIDE BODY 0.01 % EX OIL: 1.0000 "application " | TOPICAL_OIL | CUTANEOUS | 3 refills | Status: AC

## 2021-01-12 MED ORDER — KETOCONAZOLE 2 % EX SHAM: 1.0000 "application " | MEDICATED_SHAMPOO | CUTANEOUS | 3 refills | Status: DC

## 2021-01-12 NOTE — Progress Notes (Signed)
   New Patient Visit  Subjective  Martha Singleton is a 75 y.o. female who presents for the following: Other (Spot of post scalp that is itchy sometimes. She has several creams that Dr. Koleen Nimrod and Dr Doy Hutching have given her over the years. She has an appointment to see a rheumatologist for her arthritis.).    The following portions of the chart were reviewed this encounter and updated as appropriate:       Review of Systems:  No other skin or systemic complaints except as noted in HPI or Assessment and Plan.  Objective  Well appearing patient in no apparent distress; mood and affect are within normal limits.  A focused examination was performed including face, scalp, back. Relevant physical exam findings are noted in the Assessment and Plan.  Objective  Scalp: Pink scaly patch of occipital scalp, bilateral postauricular areas. Erythema and scale of ear canals, temporal scalp.  Objective  Left Upper Back: Dilated pore.   Assessment & Plan  Psoriasis Scalp  Scalp and ears  Psoriasis is a chronic non-curable, but treatable genetic/hereditary disease that may have other systemic features affecting other organ systems such as joints (Psoriatic Arthritis). It is associated with an increased risk of inflammatory bowel disease, heart disease, non-alcoholic fatty liver disease, and depression.    Discussed systemic treatment with Otezla vs biologic treatments. Recommend waiting to see what recommendations the rheumatologist has before deciding. Would recommend either Otezla or Cosentyx vs Taltz for her psoriasis if she is also diagnosed with psoriatic arthritis.  Start Ketoconazole 2% shampoo apply to scalp and let sit several minutes before rinsing at least 3 times per week  Derma-Smoothe F/S oil qhs cover with shower cap O/N until improved then 1-2 times per week for maintenance  Continue Fluocinonide solution qd-bid to aas scalp prn itch  ketoconazole (NIZORAL) 2 % shampoo -  Scalp  Fluocinolone Acetonide Body 0.01 % OIL - Scalp  Dilated pore of Winer Left Upper Back  Benign, observe.    Seborrheic Keratoses - Stuck-on, waxy, tan-brown papules and plaques, back - Discussed benign etiology and prognosis. - Observe - Call for any changes   Return in about 3 months (around 04/14/2021) for Psoriasis.  I, Ashok Cordia, CMA, am acting as scribe for Brendolyn Patty, MD .  Documentation: I have reviewed the above documentation for accuracy and completeness, and I agree with the above.  Brendolyn Patty MD

## 2021-01-12 NOTE — Patient Instructions (Addendum)
Psoriasis is a chronic non-curable, but treatable genetic/hereditary disease that may have other systemic features affecting other organ systems such as joints (Psoriatic Arthritis). It is associated with an increased risk of inflammatory bowel disease, heart disease, non-alcoholic fatty liver disease, and depression.    Discussed systemic treatment with [place] vs biologic treatments. Recommend waiting to see what recommendations the rheumatologist has before deciding. Would recommend Cosentyx vs Taltz for her psoriasis and is also diagnosed with psoriatic arthritis.  Start Ketoconazole 2% shampoo apply to scalp and let sit several minutes before rinsing at least 3 times per week  Derma-Smoothe F/S oil at bedtime until improved then 1-2 times per week for maintenance  Continue Fluocinonide solution once or twice a day as needed for itch   If you have any questions or concerns for your doctor, please call our main line at [phone] and press option 4 to reach your doctor's medical assistant. If no one answers, please leave a voicemail as directed and we will return your call as soon as possible. Messages left after 4 pm will be answered the following business day.   You may also send [place] a message via [place]. We typically respond to MyChart messages within 1-2 business days.  For prescription refills, please ask your pharmacy to contact our office. Our fax number is [phone].  If you have an urgent issue when the clinic is closed that cannot wait until the next business day, you can page your doctor at the number below.    Please note that while we do our best to be available for urgent issues outside of office hours, we are not available 24/7.   If you have an urgent issue and are unable to reach [place], you may choose to seek medical care at your doctor's office, retail clinic, urgent care center, or emergency room.  If you have a medical emergency, please immediately call 911 or go to the  emergency department.  Pager Numbers  - [name]: [phone]  - Dr. Laurence Ferrari: [phone]  - Dr. Nicole Kindred: [phone]  In the event of inclement weather, please call our main line at [phone] for an update on the status of any delays or closures.  Dermatology Medication Tips: Please keep the boxes that topical medications come in in order to help keep track of the instructions about where and how to use these. Pharmacies typically print the medication instructions only on the boxes and not directly on the medication tubes.   If your medication is too expensive, please contact our office at [phone] option 4 or send [place] a message through [place].   We are unable to tell what your co-pay for medications will be in advance as this is different depending on your insurance coverage. However, we may be able to find a substitute medication at lower cost or fill out paperwork to get insurance to cover a needed medication.   If a prior authorization is required to get your medication covered by your insurance company, please allow [place] 1-2 business days to complete this process.  Drug prices often vary depending on where the prescription is filled and some pharmacies may offer cheaper prices.  The website www.goodrx.com contains coupons for medications through different pharmacies. The prices here do not account for what the cost may be with help from insurance (it may be cheaper with your insurance), but the website can give you the price if you did not use any insurance.  - You can print the associated coupon and  take it with your prescription to the pharmacy.  - You may also stop by our office during regular business hours and pick up a GoodRx coupon card.  - If you need your prescription sent electronically to a different pharmacy, notify our office through [facility] or by phone at [phone] option 4.

## 2021-01-13 DIAGNOSIS — Z0000 Encounter for general adult medical examination without abnormal findings: Secondary | ICD-10-CM | POA: Diagnosis not present

## 2021-01-13 DIAGNOSIS — I10 Essential (primary) hypertension: Secondary | ICD-10-CM | POA: Diagnosis not present

## 2021-01-13 DIAGNOSIS — Z79899 Other long term (current) drug therapy: Secondary | ICD-10-CM | POA: Diagnosis not present

## 2021-01-13 DIAGNOSIS — E785 Hyperlipidemia, unspecified: Secondary | ICD-10-CM | POA: Diagnosis not present

## 2021-01-13 DIAGNOSIS — L4050 Arthropathic psoriasis, unspecified: Secondary | ICD-10-CM | POA: Diagnosis not present

## 2021-01-13 DIAGNOSIS — E1165 Type 2 diabetes mellitus with hyperglycemia: Secondary | ICD-10-CM | POA: Diagnosis not present

## 2021-02-17 DIAGNOSIS — R1030 Lower abdominal pain, unspecified: Secondary | ICD-10-CM | POA: Diagnosis not present

## 2021-02-17 DIAGNOSIS — R110 Nausea: Secondary | ICD-10-CM | POA: Diagnosis not present

## 2021-02-17 DIAGNOSIS — R300 Dysuria: Secondary | ICD-10-CM | POA: Diagnosis not present

## 2021-02-22 ENCOUNTER — Other Ambulatory Visit: Payer: Self-pay

## 2021-02-22 DIAGNOSIS — L409 Psoriasis, unspecified: Secondary | ICD-10-CM

## 2021-02-22 MED ORDER — KETOCONAZOLE 2 % EX SHAM: 1.0000 "application " | MEDICATED_SHAMPOO | CUTANEOUS | 3 refills | Status: DC

## 2021-02-22 NOTE — Telephone Encounter (Signed)
Pt called in to have Ketoconazole shampoo sent to [place], ok erx'd Ketoconazole shampoo to [place]

## 2021-03-02 DIAGNOSIS — M170 Bilateral primary osteoarthritis of knee: Secondary | ICD-10-CM | POA: Diagnosis not present

## 2021-03-02 DIAGNOSIS — G8929 Other chronic pain: Secondary | ICD-10-CM | POA: Diagnosis not present

## 2021-03-02 DIAGNOSIS — L4050 Arthropathic psoriasis, unspecified: Secondary | ICD-10-CM | POA: Diagnosis not present

## 2021-03-02 DIAGNOSIS — M25562 Pain in left knee: Secondary | ICD-10-CM | POA: Diagnosis not present

## 2021-03-02 DIAGNOSIS — M1A0790 Idiopathic chronic gout, unspecified ankle and foot, without tophus (tophi): Secondary | ICD-10-CM | POA: Diagnosis not present

## 2021-03-02 DIAGNOSIS — M533 Sacrococcygeal disorders, not elsewhere classified: Secondary | ICD-10-CM | POA: Diagnosis not present

## 2021-03-02 DIAGNOSIS — L409 Psoriasis, unspecified: Secondary | ICD-10-CM | POA: Diagnosis not present

## 2021-03-02 DIAGNOSIS — Z79899 Other long term (current) drug therapy: Secondary | ICD-10-CM | POA: Diagnosis not present

## 2021-03-02 DIAGNOSIS — Z111 Encounter for screening for respiratory tuberculosis: Secondary | ICD-10-CM | POA: Diagnosis not present

## 2021-03-02 DIAGNOSIS — M25561 Pain in right knee: Secondary | ICD-10-CM | POA: Diagnosis not present

## 2021-03-03 DIAGNOSIS — M170 Bilateral primary osteoarthritis of knee: Secondary | ICD-10-CM | POA: Diagnosis not present

## 2021-03-03 DIAGNOSIS — M533 Sacrococcygeal disorders, not elsewhere classified: Secondary | ICD-10-CM | POA: Diagnosis not present

## 2021-03-03 DIAGNOSIS — L4050 Arthropathic psoriasis, unspecified: Secondary | ICD-10-CM | POA: Diagnosis not present

## 2021-03-09 DIAGNOSIS — I10 Essential (primary) hypertension: Secondary | ICD-10-CM | POA: Diagnosis not present

## 2021-03-09 DIAGNOSIS — M79605 Pain in left leg: Secondary | ICD-10-CM | POA: Diagnosis not present

## 2021-03-09 DIAGNOSIS — L4050 Arthropathic psoriasis, unspecified: Secondary | ICD-10-CM | POA: Diagnosis not present

## 2021-03-09 DIAGNOSIS — M79604 Pain in right leg: Secondary | ICD-10-CM | POA: Diagnosis not present

## 2021-03-09 DIAGNOSIS — E118 Type 2 diabetes mellitus with unspecified complications: Secondary | ICD-10-CM | POA: Diagnosis not present

## 2021-03-12 DIAGNOSIS — R6889 Other general symptoms and signs: Secondary | ICD-10-CM | POA: Diagnosis not present

## 2021-03-12 DIAGNOSIS — H35341 Macular cyst, hole, or pseudohole, right eye: Secondary | ICD-10-CM | POA: Diagnosis not present

## 2021-03-28 DIAGNOSIS — R3989 Other symptoms and signs involving the genitourinary system: Secondary | ICD-10-CM | POA: Diagnosis not present

## 2021-03-28 DIAGNOSIS — N390 Urinary tract infection, site not specified: Secondary | ICD-10-CM | POA: Diagnosis not present

## 2021-03-28 DIAGNOSIS — A499 Bacterial infection, unspecified: Secondary | ICD-10-CM | POA: Diagnosis not present

## 2021-03-28 DIAGNOSIS — M25572 Pain in left ankle and joints of left foot: Secondary | ICD-10-CM | POA: Diagnosis not present

## 2021-04-12 DIAGNOSIS — R6889 Other general symptoms and signs: Secondary | ICD-10-CM | POA: Diagnosis not present

## 2021-04-12 DIAGNOSIS — H35341 Macular cyst, hole, or pseudohole, right eye: Secondary | ICD-10-CM | POA: Diagnosis not present

## 2021-04-13 ENCOUNTER — Ambulatory Visit: Payer: Medicare HMO | Admitting: Dermatology

## 2021-04-19 DIAGNOSIS — G8929 Other chronic pain: Secondary | ICD-10-CM | POA: Diagnosis not present

## 2021-04-19 DIAGNOSIS — R6889 Other general symptoms and signs: Secondary | ICD-10-CM | POA: Diagnosis not present

## 2021-04-19 DIAGNOSIS — M25561 Pain in right knee: Secondary | ICD-10-CM | POA: Diagnosis not present

## 2021-04-19 DIAGNOSIS — Z111 Encounter for screening for respiratory tuberculosis: Secondary | ICD-10-CM | POA: Diagnosis not present

## 2021-04-19 DIAGNOSIS — Z87440 Personal history of urinary (tract) infections: Secondary | ICD-10-CM | POA: Diagnosis not present

## 2021-04-19 DIAGNOSIS — R300 Dysuria: Secondary | ICD-10-CM | POA: Diagnosis not present

## 2021-04-19 DIAGNOSIS — M1A0790 Idiopathic chronic gout, unspecified ankle and foot, without tophus (tophi): Secondary | ICD-10-CM | POA: Diagnosis not present

## 2021-04-19 DIAGNOSIS — Z79899 Other long term (current) drug therapy: Secondary | ICD-10-CM | POA: Diagnosis not present

## 2021-04-19 DIAGNOSIS — L4050 Arthropathic psoriasis, unspecified: Secondary | ICD-10-CM | POA: Diagnosis not present

## 2021-04-19 DIAGNOSIS — E118 Type 2 diabetes mellitus with unspecified complications: Secondary | ICD-10-CM | POA: Diagnosis not present

## 2021-04-19 DIAGNOSIS — M25562 Pain in left knee: Secondary | ICD-10-CM | POA: Diagnosis not present

## 2021-05-03 DIAGNOSIS — Z79899 Other long term (current) drug therapy: Secondary | ICD-10-CM | POA: Diagnosis not present

## 2021-05-03 DIAGNOSIS — E119 Type 2 diabetes mellitus without complications: Secondary | ICD-10-CM | POA: Diagnosis not present

## 2021-05-03 DIAGNOSIS — L4050 Arthropathic psoriasis, unspecified: Secondary | ICD-10-CM | POA: Diagnosis not present

## 2021-05-03 DIAGNOSIS — Z0000 Encounter for general adult medical examination without abnormal findings: Secondary | ICD-10-CM | POA: Diagnosis not present

## 2021-05-03 DIAGNOSIS — I10 Essential (primary) hypertension: Secondary | ICD-10-CM | POA: Diagnosis not present

## 2021-05-03 DIAGNOSIS — M1A0790 Idiopathic chronic gout, unspecified ankle and foot, without tophus (tophi): Secondary | ICD-10-CM | POA: Diagnosis not present

## 2021-05-03 DIAGNOSIS — L409 Psoriasis, unspecified: Secondary | ICD-10-CM | POA: Diagnosis not present

## 2021-05-03 DIAGNOSIS — E782 Mixed hyperlipidemia: Secondary | ICD-10-CM | POA: Diagnosis not present

## 2021-05-14 DIAGNOSIS — R69 Illness, unspecified: Secondary | ICD-10-CM | POA: Diagnosis not present

## 2021-05-19 DIAGNOSIS — E119 Type 2 diabetes mellitus without complications: Secondary | ICD-10-CM | POA: Diagnosis not present

## 2021-05-19 DIAGNOSIS — Z7984 Long term (current) use of oral hypoglycemic drugs: Secondary | ICD-10-CM | POA: Diagnosis not present

## 2021-05-19 DIAGNOSIS — E785 Hyperlipidemia, unspecified: Secondary | ICD-10-CM | POA: Diagnosis not present

## 2021-05-19 DIAGNOSIS — H35341 Macular cyst, hole, or pseudohole, right eye: Secondary | ICD-10-CM | POA: Diagnosis not present

## 2021-05-19 DIAGNOSIS — F411 Generalized anxiety disorder: Secondary | ICD-10-CM | POA: Diagnosis not present

## 2021-05-19 DIAGNOSIS — R6889 Other general symptoms and signs: Secondary | ICD-10-CM | POA: Diagnosis not present

## 2021-05-19 DIAGNOSIS — Z79899 Other long term (current) drug therapy: Secondary | ICD-10-CM | POA: Diagnosis not present

## 2021-05-19 DIAGNOSIS — I10 Essential (primary) hypertension: Secondary | ICD-10-CM | POA: Diagnosis not present

## 2021-05-19 DIAGNOSIS — Z90710 Acquired absence of both cervix and uterus: Secondary | ICD-10-CM | POA: Diagnosis not present

## 2021-05-20 DIAGNOSIS — R6889 Other general symptoms and signs: Secondary | ICD-10-CM | POA: Diagnosis not present

## 2021-06-15 DIAGNOSIS — R6889 Other general symptoms and signs: Secondary | ICD-10-CM | POA: Diagnosis not present

## 2021-06-15 DIAGNOSIS — H35341 Macular cyst, hole, or pseudohole, right eye: Secondary | ICD-10-CM | POA: Diagnosis not present

## 2021-07-20 DIAGNOSIS — H35341 Macular cyst, hole, or pseudohole, right eye: Secondary | ICD-10-CM | POA: Diagnosis not present

## 2021-08-04 DIAGNOSIS — E118 Type 2 diabetes mellitus with unspecified complications: Secondary | ICD-10-CM | POA: Diagnosis not present

## 2021-08-04 DIAGNOSIS — E782 Mixed hyperlipidemia: Secondary | ICD-10-CM | POA: Diagnosis not present

## 2021-08-04 DIAGNOSIS — I10 Essential (primary) hypertension: Secondary | ICD-10-CM | POA: Diagnosis not present

## 2021-08-04 DIAGNOSIS — Z79899 Other long term (current) drug therapy: Secondary | ICD-10-CM | POA: Diagnosis not present

## 2021-08-10 DIAGNOSIS — E782 Mixed hyperlipidemia: Secondary | ICD-10-CM | POA: Diagnosis not present

## 2021-08-10 DIAGNOSIS — E1122 Type 2 diabetes mellitus with diabetic chronic kidney disease: Secondary | ICD-10-CM | POA: Diagnosis not present

## 2021-08-10 DIAGNOSIS — L4050 Arthropathic psoriasis, unspecified: Secondary | ICD-10-CM | POA: Diagnosis not present

## 2021-08-10 DIAGNOSIS — I129 Hypertensive chronic kidney disease with stage 1 through stage 4 chronic kidney disease, or unspecified chronic kidney disease: Secondary | ICD-10-CM | POA: Diagnosis not present

## 2021-08-10 DIAGNOSIS — N1831 Chronic kidney disease, stage 3a: Secondary | ICD-10-CM | POA: Diagnosis not present

## 2021-08-10 DIAGNOSIS — Z23 Encounter for immunization: Secondary | ICD-10-CM | POA: Diagnosis not present

## 2021-08-22 ENCOUNTER — Other Ambulatory Visit: Payer: Self-pay

## 2021-08-22 ENCOUNTER — Emergency Department
Admission: EM | Admit: 2021-08-22 | Discharge: 2021-08-22 | Disposition: A | Discharge status: Home / Self Care | Payer: Medicare HMO | Attending: Student in an Organized Health Care Education/Training Program | Admitting: Student in an Organized Health Care Education/Training Program

## 2021-08-22 ENCOUNTER — Emergency Department: Payer: Medicare HMO

## 2021-08-22 DIAGNOSIS — R079 Chest pain, unspecified: Secondary | ICD-10-CM

## 2021-08-22 DIAGNOSIS — R519 Headache, unspecified: Secondary | ICD-10-CM | POA: Diagnosis not present

## 2021-08-22 DIAGNOSIS — I517 Cardiomegaly: Secondary | ICD-10-CM | POA: Diagnosis not present

## 2021-08-22 DIAGNOSIS — Z7982 Long term (current) use of aspirin: Secondary | ICD-10-CM | POA: Diagnosis not present

## 2021-08-22 DIAGNOSIS — E119 Type 2 diabetes mellitus without complications: Secondary | ICD-10-CM | POA: Insufficient documentation

## 2021-08-22 DIAGNOSIS — I10 Essential (primary) hypertension: Secondary | ICD-10-CM | POA: Diagnosis not present

## 2021-08-22 DIAGNOSIS — Z87891 Personal history of nicotine dependence: Secondary | ICD-10-CM | POA: Diagnosis not present

## 2021-08-22 DIAGNOSIS — Z79899 Other long term (current) drug therapy: Secondary | ICD-10-CM | POA: Diagnosis not present

## 2021-08-22 DIAGNOSIS — R0789 Other chest pain: Secondary | ICD-10-CM | POA: Diagnosis not present

## 2021-08-22 DIAGNOSIS — Z7984 Long term (current) use of oral hypoglycemic drugs: Secondary | ICD-10-CM | POA: Diagnosis not present

## 2021-08-22 DIAGNOSIS — J45909 Unspecified asthma, uncomplicated: Secondary | ICD-10-CM | POA: Diagnosis not present

## 2021-08-22 LAB — URINALYSIS, ROUTINE W REFLEX MICROSCOPIC
Bilirubin Urine: NEGATIVE
Glucose, UA: NEGATIVE mg/dL
Hgb urine dipstick: NEGATIVE
Ketones, ur: NEGATIVE mg/dL
Leukocytes,Ua: NEGATIVE
Nitrite: NEGATIVE
Protein, ur: NEGATIVE mg/dL
Specific Gravity, Urine: 1.001 — ABNORMAL LOW (ref 1.005–1.030)
pH: 6 (ref 5.0–8.0)

## 2021-08-22 LAB — BASIC METABOLIC PANEL
Anion gap: 11 (ref 5–15)
BUN: 19 mg/dL (ref 8–23)
CO2: 22 mmol/L (ref 22–32)
Calcium: 9.2 mg/dL (ref 8.9–10.3)
Chloride: 101 mmol/L (ref 98–111)
Creatinine, Ser: 0.86 mg/dL (ref 0.44–1.00)
GFR, Estimated: >60 mL/min (ref >60)
Glucose, Bld: 129 mg/dL — ABNORMAL HIGH (ref 70–99)
Potassium: 3.4 mmol/L — ABNORMAL LOW (ref 3.5–5.1)
Sodium: 134 mmol/L — ABNORMAL LOW (ref 135–145)

## 2021-08-22 LAB — TROPONIN I (HIGH SENSITIVITY)
Troponin I (High Sensitivity): 18 ng/L — ABNORMAL HIGH (ref <18)
Troponin I (High Sensitivity): 22 ng/L — ABNORMAL HIGH (ref <18)
Troponin I (High Sensitivity): 18 ng/L — ABNORMAL HIGH (ref <18)

## 2021-08-22 LAB — CBC
HCT: 37.3 % (ref 36.0–46.0)
Hemoglobin: 13 g/dL (ref 12.0–15.0)
MCH: 30.8 pg (ref 26.0–34.0)
MCHC: 34.9 g/dL (ref 30.0–36.0)
MCV: 88.4 fL (ref 80.0–100.0)
Platelets: 286 10*3/uL (ref 150–400)
RBC: 4.22 MIL/uL (ref 3.87–5.11)
RDW: 12.4 % (ref 11.5–15.5)
WBC: 6.9 10*3/uL (ref 4.0–10.5)
nRBC: 0 % (ref 0.0–0.2)

## 2021-08-22 MED ORDER — CARVEDILOL 3.125 MG PO TABS: 3.1250 mg | ORAL_TABLET | Freq: Two times a day (BID) | ORAL | 11 refills | Status: AC

## 2021-08-22 NOTE — ED Notes (Signed)
Patient given discharge instructions, all questions answered. Patient in possession of all belongings, directed to the discharge area  

## 2021-08-22 NOTE — ED Provider Notes (Signed)
[facility] Emergency Department Provider Note  ____________________________________________   Event Date/Time   First MD Initiated Contact with Patient 08/22/21 [phone]     (approximate)  I have reviewed the triage vital signs and the nursing notes.   HISTORY  Chief Complaint Chest Pain   HPI Martha Singleton is a 75 y.o. female with a history of diabetes, hypertension, CKD, LVH,  presents to the emergency department for treatment and evaluation of chest pain that started around 9pm. Pain was midsternal and radiated into the right side of her jaw and shoulder.  On her home blood pressure monitor, her systolic pressure was "over 200."  She took clonidine.  She has been pain-free since.  Past Medical History:  Diagnosis Date   Anxiety    Asthma    Complication of anesthesia    nausea and vomiting    Depression    Diabetes mellitus type 2, diet-controlled ([place]) 02/18/16   A1c 6.7   Dyspnea    Dysrhythmia    GERD (gastroesophageal reflux disease)    Gout    Hyperlipidemia    Hypertension    Lichen sclerosus    Lumbago    Obesity    Osteopenia    Sinus bradycardia    Stress incontinence     Patient Active Problem List   Diagnosis Date Noted   Intraductal papilloma    PVC's (premature ventricular contractions) 03/23/2018   VHD (valvular heart disease) 03/23/2018   Weight gain 10/25/2017   Diabetes mellitus without complication ([place]) 12/75/1700   Prediabetes 12/05/2016   Hyperlipidemia 02/18/2016   Allergic rhinitis 02/18/2016   Tick bite 07/09/2015   Bradycardia 07/09/2015   Essential hypertension 06/01/2015   Generalized anxiety disorder 06/01/2015   Itchy skin 06/01/2015   Anal fissure 06/01/2015   LVH (left ventricular hypertrophy) due to hypertensive disease, without heart failure 03/09/2015    Past Surgical History:  Procedure Laterality Date   ABDOMINAL HYSTERECTOMY     total   BREAST BIOPSY Right 02/2018   9:00,3 cmfn benign    BREAST BIOPSY Right 02/2018   9:00,7 cmfn intraductal papilloma   BREAST LUMPECTOMY Right 04/03/2018   BREAST LUMPECTOMY WITH NEEDLE LOCALIZATION Right 04/03/2018   Procedure: BREAST LUMPECTOMY WITH NEEDLE LOCALIZATION;  Surgeon: Jules Husbands, MD;  Location: [facility] ORS;  Service: General;  Laterality: Right;   CHOLECYSTECTOMY     COLONOSCOPY N/A 09/06/2017   Procedure: COLONOSCOPY;  Surgeon: Toledo, Benay Pike, MD;  Location: [facility] ENDOSCOPY;  Service: Gastroenterology;  Laterality: N/A;   EYE SURGERY     Cataract   INCONTINENCE SURGERY     SINUS EXPLORATION     pollyps removed    Prior to Admission medications   Medication Sig Start Date End Date Taking? Authorizing Provider  carvedilol (COREG) 3.125 MG tablet Take 1 tablet (3.125 mg total) by mouth 2 (two) times daily. 08/22/21 08/22/22 Yes Viktor Philipp, Dessa Phi, FNP  ACCU-CHEK AVIVA PLUS test strip  04/09/18   [provider]  acetaminophen (TYLENOL) 500 MG tablet Take 1,000 mg by mouth every 8 (eight) hours as needed (for pain/headaches.).    [provider]  albuterol (PROVENTIL HFA;VENTOLIN HFA) 108 (90 BASE) MCG/ACT inhaler Inhale 2 puffs into the lungs every 6 (six) hours as needed for wheezing or shortness of breath. Reported on 02/18/2016    [provider]  amLODipine (NORVASC) 10 MG tablet Take 1 tablet (10 mg total) by mouth daily. 09/14/16   Volney American, PA-C  aspirin 81 MG chewable tablet Chew 81 mg by mouth daily.    [provider]  atorvastatin (LIPITOR) 20 MG tablet TAKE 1 TABLET AT BEDTIME (PLEASE CALL TO MAKE AN APPOINTMENT) Patient taking differently: Take 20 mg by mouth daily.  09/14/16   Volney American, PA-C  B Complex Vitamins (VITAMIN-B COMPLEX) TABS Take 1 tablet by mouth at bedtime.     [provider]  Cholecalciferol (VITAMIN D3) 2000 units TABS Take 2,000 Units by mouth at bedtime.    [provider]  clobetasol ointment (TEMOVATE) 1.61 % Apply 1  application topically 2 (two) times daily as needed (for skin irritation).  06/01/15   [provider]  dimenhyDRINATE (DRAMAMINE) 50 MG tablet  01/24/18   [provider]  gabapentin (NEURONTIN) 100 MG capsule Take 1 capsule (100 mg total) by mouth 3 (three) times daily. 04/19/18   Pabon, Nickerson, MD  glimepiride (AMARYL) 2 MG tablet Take 2 mg by mouth daily with breakfast.    [provider]  hydrochlorothiazide (HYDRODIURIL) 25 MG tablet Take 25 mg by mouth daily.    [provider]  HYDROcodone-acetaminophen (NORCO/VICODIN) 5-325 MG tablet Take 1-2 tablets by mouth every 4 (four) hours as needed for moderate pain. 04/03/18   Pabon, Beauregard, MD  hydrocortisone 2.5 % cream Apply 1 application topically 2 (two) times daily as needed (for skin irritation/discomfort).    [provider]  ibuprofen (ADVIL,MOTRIN) 200 MG tablet Take 400 mg by mouth every 8 (eight) hours as needed (for pain.).    [provider]  ketoconazole (NIZORAL) 2 % shampoo Apply 1 application topically as directed. apply to scalp and let sit several minutes before rinsing. 02/22/21   Brendolyn Patty, MD  omeprazole (PRILOSEC) 20 MG capsule TAKE 1 CAPSULE ONE TIME DAILY AS NEEDED Patient taking differently: TAKE 1 CAPSULE ONE TIME DAILY 08/18/15   Park Liter P, DO  telmisartan (MICARDIS) 80 MG tablet Take 80 mg by mouth daily.  03/10/18   [provider]  traZODone (DESYREL) 50 MG tablet Take 50 mg by mouth at bedtime.    [provider]  vitamin E 400 UNIT capsule Take 400 Units by mouth daily.  07/02/15   [provider]    Allergies Benazepril, Hydralazine, Atenolol, Cefdinir, Flexeril [cyclobenzaprine], and Hydrochlorothiazide  Family History  Problem Relation Age of Onset   Heart disease Mother    Heart disease Father    Heart disease Brother    Hypertension Son    CAD Maternal Grandfather    Heart disease Brother    Hypertension Son    Breast  cancer Maternal Aunt 60   Breast cancer Cousin 76       2 mat cousins    Social History Social History   Tobacco Use   Smoking status: Former    Types: Cigarettes    Quit date: 10/24/2000    Years since quitting: 20.8   Smokeless tobacco: Never  Vaping Use   Vaping Use: Never used  Substance Use Topics   Alcohol use: Not Currently   Drug use: No    Review of Systems  Constitutional: No fever/chills. Eyes: No visual changes. ENT: No sore throat. Cardiovascular: Positive for chest pain.  Negative for pleuritic pain.  Negative for palpitations.  Negative for leg pain. Respiratory: Negative for shortness of breath. Gastrointestinal: No abdominal pain.  Negative for nausea, no vomiting.  No diarrhea.  No constipation. Genitourinary: Negative for dysuria. Musculoskeletal: Negative for back  pain.  Skin: Negative for rash, lesion, wound. Neurological: Negative for headaches, focal weakness or numbness  ____________________________________________   PHYSICAL EXAM:  VITAL SIGNS: ED Triage Vitals [08/22/21 0054]  Enc Vitals Group     BP (!) 190/64     Pulse Rate (!) 53     Resp 16     Temp 97.9 F (36.6 C)     Temp Source Oral     SpO2 97 %     Weight 164 lb (74.4 kg)     Height 4\' 11"  (1.499 m)     Head Circumference      Peak Flow      Pain Score 0     Pain Loc      Pain Edu?      Excl. in [place]?     Constitutional: Alert and oriented.  Overall well appearing and in no acute distress.  Normal mental status. Eyes: Conjunctivae are normal. PERRL. Head: Atraumatic. Nose: No congestion/rhinnorhea. Mouth/Throat: Mucous membranes are moist.  Oropharynx non-erythematous. Tongue normal in size and color. Neck: No stridor.  No carotid bruit appreciated on exam. Hematological/Lymphatic/Immunilogical: No cervical lymphadenopathy. Cardiovascular: Normal rate, regular rhythm. Grossly normal heart sounds.  Good peripheral circulation. Respiratory: Normal respiratory effort.  No  retractions. Lungs CTAB. Gastrointestinal: Soft and nontender. No distention. No abdominal bruits. No CVA tenderness. Genitourinary: Exam deferred. Musculoskeletal: No lower extremity tenderness.  No edema of extremities. Neurologic:  Normal speech and language. No gross focal neurologic deficits are appreciated. Skin:  Skin is warm, dry and intact. No rash noted. Psychiatric: Mood and affect are normal. Speech and behavior are normal.  ____________________________________________   LABS (all labs ordered are listed, but only abnormal results are displayed)  Labs Reviewed  BASIC METABOLIC PANEL - Abnormal; Notable for the following components:      Result Value   Sodium 134 (*)    Potassium 3.4 (*)    Glucose, Bld 129 (*)    All other components within normal limits  URINALYSIS, ROUTINE W REFLEX MICROSCOPIC - Abnormal; Notable for the following components:   Color, Urine COLORLESS (*)    APPearance CLEAR (*)    Specific Gravity, Urine 1.001 (*)    All other components within normal limits  TROPONIN I (HIGH SENSITIVITY) - Abnormal; Notable for the following components:   Troponin I (High Sensitivity) 18 (*)    All other components within normal limits  TROPONIN I (HIGH SENSITIVITY) - Abnormal; Notable for the following components:   Troponin I (High Sensitivity) 22 (*)    All other components within normal limits  TROPONIN I (HIGH SENSITIVITY) - Abnormal; Notable for the following components:   Troponin I (High Sensitivity) 18 (*)    All other components within normal limits  CBC  TROPONIN I (HIGH SENSITIVITY)   ____________________________________________  EKG  ED ECG REPORT I, Cristalle Rohm, FNP-BC personally viewed and interpreted this ECG.   Date: 08/22/2021  EKG Time: 0909  Rate: 72  Rhythm: unchanged from previous tracings, normal sinus rhythm  Axis: normal  Intervals:nonspecific intraventricular conduction delay  ST&T Change: no ST  elevation  ____________________________________________  RADIOLOGY  ED MD interpretation: Chest x-ray without acute concerns.  I, Sherrie George, personally viewed and evaluated these images (plain radiographs) as part of my medical decision making, as well as reviewing the written report by the radiologist.  Official radiology report(s): DG Chest 2 View  Result Date: 08/22/2021 CLINICAL DATA:  Chest pain EXAM: CHEST - 2 VIEW COMPARISON:  None. FINDINGS: Mild lingular scarring. Right lung is clear. No pleural effusion or pneumothorax. Cardiomegaly.  Thoracic aortic atherosclerosis. Visualized thoracolumbar spine is within normal limits. Cholecystectomy clips. IMPRESSION: Cardiomegaly. Otherwise normal chest radiographs. Electronically Signed   By: Julian Hy M.D.   On: 08/22/2021 01:37   CT HEAD WO CONTRAST (5MM)  Result Date: 08/22/2021 CLINICAL DATA:  Diplopia, headache EXAM: CT HEAD WITHOUT CONTRAST TECHNIQUE: Contiguous axial images were obtained from the base of the skull through the vertex without intravenous contrast. COMPARISON:  06/01/2011 FINDINGS: Brain: Normal anatomic configuration. Parenchymal volume loss is commensurate with the patient's age. Mild periventricular white matter changes are present likely reflecting the sequela of small vessel ischemia. No abnormal intra or extra-axial mass lesion or fluid collection. No abnormal mass effect or midline shift. No evidence of acute intracranial hemorrhage or infarct. Ventricular size is normal. Cerebellum unremarkable. Vascular: No asymmetric hyperdense vasculature at the skull base. Skull: Intact Sinuses/Orbits: Layering mucus is seen within the right sphenoid sinus. Right maxillary antrostomy and bilateral middle turbinectomy has been performed. There is moderate mucosal thickening noted within the nasal passages. Remaining paranasal sinuses are clear. Orbits are unremarkable. Other: Mastoid air cells and middle ear cavities are  clear. IMPRESSION: No acute intracranial abnormality.  Mild senescent change. Mild to moderate paranasal sinus disease. Electronically Signed   By: Fidela Salisbury M.D.   On: 08/22/2021 02:05    ____________________________________________   PROCEDURES  Procedure(s) performed: None  Procedures  Critical Care performed: No  ____________________________________________   INITIAL IMPRESSION / ASSESSMENT AND PLAN   75 year old female presenting to the emergency department for treatment and evaluation of chest pain that has since gone away.  Serial troponins are 18, 22, and 18.  No EKG changes.  Patient states that she feels her chest pain was related to blood pressure systolic over 326.  Repeat blood pressure here with systolic of 712.  Patient states that her primary care provider has recently made some changes to her blood pressure medications due to to decreasing renal function and she does not feel like clonidine "is holding it."  Stress test in 2020 was normal.  EKG remains unchanged from 2019.  No echo report found.  Consulted with [name] who is her cardiologist.  Plan will be to stop the clonidine and have her start carvedilol 3.125 mg.  She already has an appointment with him on November 10.  He plans to have someone contact her on Monday to see how she is feeling.  Plans discussed with the patient and family.  Patient discharged home pain-free and in stable condition.   Differential includes, but is not limited to, viral syndrome, bronchitis including COPD exacerbation, reactive airway disease including asthma, CHF including exacerbation with or without pulmonary/interstitial edema, pneumothorax, ACS, thoracic trauma, and pulmonary embolism, ACS, aortic dissection, pulmonary embolism, cardiac tamponade, pneumothorax, pneumonia, pericarditis, myocarditis, GI-related causes including esophagitis/gastritis, and musculoskeletal chest wall pain.       FINAL CLINICAL  IMPRESSION(S) / ED DIAGNOSES  Final diagnoses:  Hypertension, unspecified type  Chest pain, unspecified type     ED Discharge Orders          Ordered    carvedilol (COREG) 3.125 MG tablet  2 times daily        08/22/21 [place] was evaluated in Emergency Department on 08/22/2021 for the symptoms described in the history of present illness. She was evaluated in  the context of the global COVID-19 pandemic, which necessitated consideration that the patient might be at risk for infection with the SARS-CoV-2 virus that causes COVID-19. Institutional protocols and algorithms that pertain to the evaluation of patients at risk for COVID-19 are in a state of rapid change based on information released by regulatory bodies including the CDC and federal and state organizations. These policies and algorithms were followed during the patient's care in the ED.   Note:  This document was prepared using Dragon voice recognition software and may include unintentional dictation errors.    Victorino Dike, FNP 08/22/21 1524    Merlyn Lot, MD 08/22/21 1550

## 2021-08-22 NOTE — ED Triage Notes (Signed)
Pt presents to ER c/o chest pain that started around 2100 yesterday.  Pt states pain started in her chest and radiated to her jaw and behind right shoulder blade.  Pt states she took 1 clonidine after taking her BP which was over 053 systolic. Pt states she is now pain free after taking the clonidine.  Pt A&O x4. Pt denies hx of MI.

## 2021-08-22 NOTE — ED Provider Notes (Signed)
Emergency Medicine Provider Triage Evaluation Note  KHANDI KERNES , a 75 y.o. female  was evaluated in triage.  Pt complains of left-sided chest pressure that started at 9 PM that radiated into the back and into the jaw.  Had associated mild headache and double vision.  Blood pressure was elevated at home.  Took an extra clonidine at 11:30 PM..  Review of Systems  Positive: Chest pain, headache, double vision Negative: Numbness, tingling, weakness, shortness of breath  Physical Exam  BP (!) 190/64 (BP Location: Left Arm)   Pulse (!) 53   Temp 97.9 F (36.6 C) (Oral)   Resp 16   Ht 4\' 11"  (1.499 m)   Wt 74.4 kg   SpO2 97%   BMI 33.12 kg/m  Gen:   Awake, no distress   Resp:  Normal effort  MSK:   Moves extremities without difficulty  Other:  No facial asymmetry, normal speech  Medical Decision Making  Medically screening exam initiated at 12:58 AM.  Appropriate orders placed.  DWIGHT BURDO was informed that the remainder of the evaluation will be completed by another provider, this initial triage assessment does not replace that evaluation, and the importance of remaining in the ED until their evaluation is complete.     Jeweline Reif, Delice Bison, DO 08/22/21 0100

## 2021-08-22 NOTE — ED Notes (Addendum)
Report received. Patient resting quietly in the bed, with eyes closed, family at bedside. Denies any pain at this time.

## 2021-08-22 NOTE — Discharge Instructions (Addendum)
Stop the clonidine. Start Carvedilol.

## 2021-08-24 DIAGNOSIS — I10 Essential (primary) hypertension: Secondary | ICD-10-CM | POA: Diagnosis not present

## 2021-08-24 DIAGNOSIS — E118 Type 2 diabetes mellitus with unspecified complications: Secondary | ICD-10-CM | POA: Diagnosis not present

## 2021-08-24 DIAGNOSIS — I119 Hypertensive heart disease without heart failure: Secondary | ICD-10-CM | POA: Diagnosis not present

## 2021-08-24 DIAGNOSIS — N1831 Chronic kidney disease, stage 3a: Secondary | ICD-10-CM | POA: Diagnosis not present

## 2021-08-24 DIAGNOSIS — I38 Endocarditis, valve unspecified: Secondary | ICD-10-CM | POA: Diagnosis not present

## 2021-08-24 DIAGNOSIS — I6523 Occlusion and stenosis of bilateral carotid arteries: Secondary | ICD-10-CM | POA: Diagnosis not present

## 2021-08-24 DIAGNOSIS — E782 Mixed hyperlipidemia: Secondary | ICD-10-CM | POA: Diagnosis not present

## 2021-08-30 ENCOUNTER — Other Ambulatory Visit: Payer: Self-pay | Admitting: Dermatology

## 2021-08-30 DIAGNOSIS — L409 Psoriasis, unspecified: Secondary | ICD-10-CM

## 2021-09-09 DIAGNOSIS — Z0100 Encounter for examination of eyes and vision without abnormal findings: Secondary | ICD-10-CM | POA: Diagnosis not present

## 2021-09-13 DIAGNOSIS — Z1231 Encounter for screening mammogram for malignant neoplasm of breast: Secondary | ICD-10-CM | POA: Diagnosis not present

## 2021-09-24 DIAGNOSIS — H903 Sensorineural hearing loss, bilateral: Secondary | ICD-10-CM | POA: Diagnosis not present

## 2021-09-27 DIAGNOSIS — F411 Generalized anxiety disorder: Secondary | ICD-10-CM | POA: Diagnosis not present

## 2021-09-27 DIAGNOSIS — E119 Type 2 diabetes mellitus without complications: Secondary | ICD-10-CM | POA: Diagnosis not present

## 2021-09-27 DIAGNOSIS — I10 Essential (primary) hypertension: Secondary | ICD-10-CM | POA: Diagnosis not present

## 2021-09-27 DIAGNOSIS — Z79899 Other long term (current) drug therapy: Secondary | ICD-10-CM | POA: Diagnosis not present

## 2021-09-27 DIAGNOSIS — E782 Mixed hyperlipidemia: Secondary | ICD-10-CM | POA: Diagnosis not present

## 2021-10-11 DIAGNOSIS — H903 Sensorineural hearing loss, bilateral: Secondary | ICD-10-CM | POA: Diagnosis not present

## 2022-01-17 DIAGNOSIS — H35341 Macular cyst, hole, or pseudohole, right eye: Secondary | ICD-10-CM | POA: Diagnosis not present

## 2022-01-24 ENCOUNTER — Other Ambulatory Visit: Payer: Self-pay | Admitting: Dermatology

## 2022-01-24 DIAGNOSIS — Z79899 Other long term (current) drug therapy: Secondary | ICD-10-CM | POA: Diagnosis not present

## 2022-01-24 DIAGNOSIS — I10 Essential (primary) hypertension: Secondary | ICD-10-CM | POA: Diagnosis not present

## 2022-01-24 DIAGNOSIS — L409 Psoriasis, unspecified: Secondary | ICD-10-CM

## 2022-01-24 DIAGNOSIS — E782 Mixed hyperlipidemia: Secondary | ICD-10-CM | POA: Diagnosis not present

## 2022-01-24 DIAGNOSIS — E118 Type 2 diabetes mellitus with unspecified complications: Secondary | ICD-10-CM | POA: Diagnosis not present

## 2022-01-31 DIAGNOSIS — N1831 Chronic kidney disease, stage 3a: Secondary | ICD-10-CM | POA: Diagnosis not present

## 2022-01-31 DIAGNOSIS — R079 Chest pain, unspecified: Secondary | ICD-10-CM | POA: Diagnosis not present

## 2022-01-31 DIAGNOSIS — Z79899 Other long term (current) drug therapy: Secondary | ICD-10-CM | POA: Diagnosis not present

## 2022-01-31 DIAGNOSIS — Z87891 Personal history of nicotine dependence: Secondary | ICD-10-CM | POA: Diagnosis not present

## 2022-01-31 DIAGNOSIS — M1A0790 Idiopathic chronic gout, unspecified ankle and foot, without tophus (tophi): Secondary | ICD-10-CM | POA: Diagnosis not present

## 2022-01-31 DIAGNOSIS — I129 Hypertensive chronic kidney disease with stage 1 through stage 4 chronic kidney disease, or unspecified chronic kidney disease: Secondary | ICD-10-CM | POA: Diagnosis not present

## 2022-01-31 DIAGNOSIS — E782 Mixed hyperlipidemia: Secondary | ICD-10-CM | POA: Diagnosis not present

## 2022-01-31 DIAGNOSIS — L4050 Arthropathic psoriasis, unspecified: Secondary | ICD-10-CM | POA: Diagnosis not present

## 2022-01-31 DIAGNOSIS — E1122 Type 2 diabetes mellitus with diabetic chronic kidney disease: Secondary | ICD-10-CM | POA: Diagnosis not present

## 2022-01-31 DIAGNOSIS — J811 Chronic pulmonary edema: Secondary | ICD-10-CM | POA: Diagnosis not present

## 2022-03-22 DIAGNOSIS — Z794 Long term (current) use of insulin: Secondary | ICD-10-CM | POA: Diagnosis not present

## 2022-03-22 DIAGNOSIS — I208 Other forms of angina pectoris: Secondary | ICD-10-CM | POA: Diagnosis not present

## 2022-03-22 DIAGNOSIS — I119 Hypertensive heart disease without heart failure: Secondary | ICD-10-CM | POA: Diagnosis not present

## 2022-03-22 DIAGNOSIS — R079 Chest pain, unspecified: Secondary | ICD-10-CM | POA: Diagnosis not present

## 2022-03-22 DIAGNOSIS — I10 Essential (primary) hypertension: Secondary | ICD-10-CM | POA: Diagnosis not present

## 2022-03-22 DIAGNOSIS — I6523 Occlusion and stenosis of bilateral carotid arteries: Secondary | ICD-10-CM | POA: Diagnosis not present

## 2022-03-22 DIAGNOSIS — E1151 Type 2 diabetes mellitus with diabetic peripheral angiopathy without gangrene: Secondary | ICD-10-CM | POA: Diagnosis not present

## 2022-03-22 DIAGNOSIS — E782 Mixed hyperlipidemia: Secondary | ICD-10-CM | POA: Diagnosis not present

## 2022-04-18 DIAGNOSIS — I6523 Occlusion and stenosis of bilateral carotid arteries: Secondary | ICD-10-CM | POA: Diagnosis not present

## 2022-04-18 DIAGNOSIS — I208 Other forms of angina pectoris: Secondary | ICD-10-CM | POA: Diagnosis not present

## 2022-04-29 ENCOUNTER — Telehealth (INDEPENDENT_AMBULATORY_CARE_PROVIDER_SITE_OTHER): Payer: Self-pay

## 2022-04-29 NOTE — Telephone Encounter (Signed)
ATC pt to advise that I received the results of carotid US and there is no need to repeat US. I cancelled the carotid US and we can r/s the new patient consult with Dr. Lucky Cowboy for earlier if pt would like. VM was full so unable to leave message. Will try again tomorrow.  (Called yesterday 7.6.23)   np. consult. carotid stenosis. carotid US results in referral packet. referred by sparks, jeffery.

## 2022-05-02 DIAGNOSIS — I119 Hypertensive heart disease without heart failure: Secondary | ICD-10-CM | POA: Diagnosis not present

## 2022-05-02 DIAGNOSIS — I10 Essential (primary) hypertension: Secondary | ICD-10-CM | POA: Diagnosis not present

## 2022-05-02 DIAGNOSIS — R001 Bradycardia, unspecified: Secondary | ICD-10-CM | POA: Diagnosis not present

## 2022-05-02 DIAGNOSIS — E782 Mixed hyperlipidemia: Secondary | ICD-10-CM | POA: Diagnosis not present

## 2022-05-02 DIAGNOSIS — I493 Ventricular premature depolarization: Secondary | ICD-10-CM | POA: Diagnosis not present

## 2022-05-02 DIAGNOSIS — R0602 Shortness of breath: Secondary | ICD-10-CM | POA: Diagnosis not present

## 2022-05-02 DIAGNOSIS — I6523 Occlusion and stenosis of bilateral carotid arteries: Secondary | ICD-10-CM | POA: Diagnosis not present

## 2022-05-14 DIAGNOSIS — H1033 Unspecified acute conjunctivitis, bilateral: Secondary | ICD-10-CM | POA: Diagnosis not present

## 2022-05-14 DIAGNOSIS — H04123 Dry eye syndrome of bilateral lacrimal glands: Secondary | ICD-10-CM | POA: Diagnosis not present

## 2022-06-15 DIAGNOSIS — Z0189 Encounter for other specified special examinations: Secondary | ICD-10-CM | POA: Diagnosis not present

## 2022-06-15 DIAGNOSIS — I10 Essential (primary) hypertension: Secondary | ICD-10-CM | POA: Diagnosis not present

## 2022-06-15 DIAGNOSIS — N1831 Chronic kidney disease, stage 3a: Secondary | ICD-10-CM | POA: Diagnosis not present

## 2022-06-15 DIAGNOSIS — Z79899 Other long term (current) drug therapy: Secondary | ICD-10-CM | POA: Diagnosis not present

## 2022-06-15 DIAGNOSIS — E118 Type 2 diabetes mellitus with unspecified complications: Secondary | ICD-10-CM | POA: Diagnosis not present

## 2022-06-21 ENCOUNTER — Encounter (INDEPENDENT_AMBULATORY_CARE_PROVIDER_SITE_OTHER): Payer: Medicare HMO | Admitting: Vascular Surgery

## 2022-06-22 DIAGNOSIS — F419 Anxiety disorder, unspecified: Secondary | ICD-10-CM | POA: Diagnosis not present

## 2022-06-22 DIAGNOSIS — E785 Hyperlipidemia, unspecified: Secondary | ICD-10-CM | POA: Diagnosis not present

## 2022-06-22 DIAGNOSIS — I10 Essential (primary) hypertension: Secondary | ICD-10-CM | POA: Diagnosis not present

## 2022-06-22 DIAGNOSIS — L4050 Arthropathic psoriasis, unspecified: Secondary | ICD-10-CM | POA: Diagnosis not present

## 2022-06-22 DIAGNOSIS — Z1231 Encounter for screening mammogram for malignant neoplasm of breast: Secondary | ICD-10-CM | POA: Diagnosis not present

## 2022-06-22 DIAGNOSIS — Z79899 Other long term (current) drug therapy: Secondary | ICD-10-CM | POA: Diagnosis not present

## 2022-06-22 DIAGNOSIS — E119 Type 2 diabetes mellitus without complications: Secondary | ICD-10-CM | POA: Diagnosis not present

## 2022-06-22 DIAGNOSIS — Z0000 Encounter for general adult medical examination without abnormal findings: Secondary | ICD-10-CM | POA: Diagnosis not present

## 2022-06-28 ENCOUNTER — Encounter (INDEPENDENT_AMBULATORY_CARE_PROVIDER_SITE_OTHER): Payer: Medicare HMO

## 2022-06-28 ENCOUNTER — Encounter (INDEPENDENT_AMBULATORY_CARE_PROVIDER_SITE_OTHER): Payer: Medicare HMO | Admitting: Vascular Surgery

## 2022-07-05 DIAGNOSIS — H524 Presbyopia: Secondary | ICD-10-CM | POA: Diagnosis not present

## 2022-07-05 DIAGNOSIS — H35341 Macular cyst, hole, or pseudohole, right eye: Secondary | ICD-10-CM | POA: Diagnosis not present

## 2022-07-18 DIAGNOSIS — M8588 Other specified disorders of bone density and structure, other site: Secondary | ICD-10-CM | POA: Diagnosis not present

## 2022-08-17 DIAGNOSIS — Z23 Encounter for immunization: Secondary | ICD-10-CM | POA: Diagnosis not present

## 2022-08-22 ENCOUNTER — Encounter (INDEPENDENT_AMBULATORY_CARE_PROVIDER_SITE_OTHER): Payer: Self-pay

## 2022-09-09 ENCOUNTER — Encounter (INDEPENDENT_AMBULATORY_CARE_PROVIDER_SITE_OTHER): Payer: Medicare HMO

## 2022-09-09 ENCOUNTER — Encounter (INDEPENDENT_AMBULATORY_CARE_PROVIDER_SITE_OTHER): Payer: Medicare HMO | Admitting: Vascular Surgery

## 2022-09-14 DIAGNOSIS — Z1231 Encounter for screening mammogram for malignant neoplasm of breast: Secondary | ICD-10-CM | POA: Diagnosis not present

## 2022-09-29 DIAGNOSIS — Z86010 Personal history of colonic polyps: Secondary | ICD-10-CM | POA: Diagnosis not present

## 2022-09-29 DIAGNOSIS — Z01818 Encounter for other preprocedural examination: Secondary | ICD-10-CM | POA: Diagnosis not present

## 2022-09-29 DIAGNOSIS — K219 Gastro-esophageal reflux disease without esophagitis: Secondary | ICD-10-CM | POA: Diagnosis not present

## 2022-10-11 ENCOUNTER — Encounter: Payer: Self-pay | Admitting: Internal Medicine

## 2022-10-12 ENCOUNTER — Ambulatory Visit: Admission: RE | Admit: 2022-10-12 | Payer: Medicare HMO | Source: Home / Self Care | Admitting: Internal Medicine

## 2022-10-12 ENCOUNTER — Encounter: Admission: RE | Payer: Self-pay | Source: Home / Self Care

## 2022-10-12 ENCOUNTER — Other Ambulatory Visit: Payer: Self-pay | Admitting: Dermatology

## 2022-10-12 DIAGNOSIS — L409 Psoriasis, unspecified: Secondary | ICD-10-CM

## 2022-10-12 SURGERY — COLONOSCOPY WITH PROPOFOL
Anesthesia: General

## 2022-10-31 DIAGNOSIS — J40 Bronchitis, not specified as acute or chronic: Secondary | ICD-10-CM | POA: Diagnosis not present

## 2022-10-31 DIAGNOSIS — Z03818 Encounter for observation for suspected exposure to other biological agents ruled out: Secondary | ICD-10-CM | POA: Diagnosis not present

## 2022-11-04 DIAGNOSIS — R059 Cough, unspecified: Secondary | ICD-10-CM | POA: Diagnosis not present

## 2022-11-04 DIAGNOSIS — I129 Hypertensive chronic kidney disease with stage 1 through stage 4 chronic kidney disease, or unspecified chronic kidney disease: Secondary | ICD-10-CM | POA: Diagnosis not present

## 2022-11-04 DIAGNOSIS — N1831 Chronic kidney disease, stage 3a: Secondary | ICD-10-CM | POA: Diagnosis not present

## 2022-11-04 DIAGNOSIS — E118 Type 2 diabetes mellitus with unspecified complications: Secondary | ICD-10-CM | POA: Diagnosis not present

## 2022-11-04 DIAGNOSIS — J40 Bronchitis, not specified as acute or chronic: Secondary | ICD-10-CM | POA: Diagnosis not present

## 2022-11-08 ENCOUNTER — Encounter (INDEPENDENT_AMBULATORY_CARE_PROVIDER_SITE_OTHER): Payer: Medicare HMO | Admitting: Vascular Surgery

## 2022-12-06 ENCOUNTER — Encounter (INDEPENDENT_AMBULATORY_CARE_PROVIDER_SITE_OTHER): Payer: Medicare HMO | Admitting: Vascular Surgery

## 2022-12-12 DIAGNOSIS — J019 Acute sinusitis, unspecified: Secondary | ICD-10-CM | POA: Diagnosis not present

## 2022-12-20 ENCOUNTER — Encounter (INDEPENDENT_AMBULATORY_CARE_PROVIDER_SITE_OTHER): Payer: Medicare HMO | Admitting: Vascular Surgery

## 2022-12-23 ENCOUNTER — Ambulatory Visit: Payer: Medicare HMO | Admitting: Internal Medicine

## 2022-12-29 ENCOUNTER — Telehealth: Payer: Self-pay | Admitting: Internal Medicine

## 2022-12-29 NOTE — Telephone Encounter (Signed)
Message to wrong provider office, this patient has never been seen at [facility].

## 2022-12-29 NOTE — Telephone Encounter (Signed)
Copied from [place] [id number]. Topic: General - Other >> Dec 29, 2022 10:11 AM Cyndi Bender wrote: Reason for CRM: Pt requests that documentation of her immunizations be mailed to her. Pt verified mailing address is correct

## 2023-01-08 DIAGNOSIS — L4050 Arthropathic psoriasis, unspecified: Secondary | ICD-10-CM | POA: Diagnosis not present

## 2023-01-08 DIAGNOSIS — M10072 Idiopathic gout, left ankle and foot: Secondary | ICD-10-CM | POA: Diagnosis not present

## 2023-01-24 DIAGNOSIS — J019 Acute sinusitis, unspecified: Secondary | ICD-10-CM | POA: Diagnosis not present

## 2023-01-25 ENCOUNTER — Encounter: Payer: Self-pay | Admitting: Internal Medicine

## 2023-01-25 DIAGNOSIS — H35341 Macular cyst, hole, or pseudohole, right eye: Secondary | ICD-10-CM | POA: Diagnosis not present

## 2023-01-25 DIAGNOSIS — M3501 Sicca syndrome with keratoconjunctivitis: Secondary | ICD-10-CM | POA: Diagnosis not present

## 2023-01-25 DIAGNOSIS — H10502 Unspecified blepharoconjunctivitis, left eye: Secondary | ICD-10-CM | POA: Diagnosis not present

## 2023-01-25 DIAGNOSIS — Z961 Presence of intraocular lens: Secondary | ICD-10-CM | POA: Diagnosis not present

## 2023-01-31 ENCOUNTER — Encounter: Payer: Self-pay | Admitting: Internal Medicine

## 2023-02-01 ENCOUNTER — Ambulatory Visit
Admission: RE | Admit: 2023-02-01 | Discharge: 2023-02-01 | Disposition: A | Discharge status: Home / Self Care | Payer: Medicare HMO | Attending: Internal Medicine | Admitting: Internal Medicine

## 2023-02-01 ENCOUNTER — Ambulatory Visit: Payer: Medicare HMO | Admitting: General Practice

## 2023-02-01 ENCOUNTER — Encounter: Payer: Self-pay | Admitting: Internal Medicine

## 2023-02-01 ENCOUNTER — Encounter
Admission: RE | Disposition: A | Discharge status: Home / Self Care | Payer: Self-pay | Source: Home / Self Care | Attending: Internal Medicine

## 2023-02-01 DIAGNOSIS — D120 Benign neoplasm of cecum: Secondary | ICD-10-CM | POA: Insufficient documentation

## 2023-02-01 DIAGNOSIS — E119 Type 2 diabetes mellitus without complications: Secondary | ICD-10-CM | POA: Diagnosis not present

## 2023-02-01 DIAGNOSIS — Z87891 Personal history of nicotine dependence: Secondary | ICD-10-CM | POA: Diagnosis not present

## 2023-02-01 DIAGNOSIS — Z7984 Long term (current) use of oral hypoglycemic drugs: Secondary | ICD-10-CM | POA: Insufficient documentation

## 2023-02-01 DIAGNOSIS — I10 Essential (primary) hypertension: Secondary | ICD-10-CM | POA: Diagnosis not present

## 2023-02-01 DIAGNOSIS — Z1211 Encounter for screening for malignant neoplasm of colon: Secondary | ICD-10-CM | POA: Diagnosis not present

## 2023-02-01 DIAGNOSIS — D125 Benign neoplasm of sigmoid colon: Secondary | ICD-10-CM | POA: Diagnosis not present

## 2023-02-01 DIAGNOSIS — R6889 Other general symptoms and signs: Secondary | ICD-10-CM | POA: Diagnosis not present

## 2023-02-01 DIAGNOSIS — K649 Unspecified hemorrhoids: Secondary | ICD-10-CM | POA: Diagnosis not present

## 2023-02-01 DIAGNOSIS — J45909 Unspecified asthma, uncomplicated: Secondary | ICD-10-CM | POA: Insufficient documentation

## 2023-02-01 DIAGNOSIS — K219 Gastro-esophageal reflux disease without esophagitis: Secondary | ICD-10-CM | POA: Diagnosis not present

## 2023-02-01 DIAGNOSIS — K635 Polyp of colon: Secondary | ICD-10-CM | POA: Diagnosis not present

## 2023-02-01 DIAGNOSIS — K640 First degree hemorrhoids: Secondary | ICD-10-CM | POA: Insufficient documentation

## 2023-02-01 DIAGNOSIS — Z86010 Personal history of colonic polyps: Secondary | ICD-10-CM | POA: Diagnosis not present

## 2023-02-01 HISTORY — PX: COLONOSCOPY: SHX5424

## 2023-02-01 LAB — GLUCOSE, CAPILLARY
Glucose-Capillary: 392 mg/dL — ABNORMAL HIGH (ref 70–99)
Glucose-Capillary: 404 mg/dL — ABNORMAL HIGH (ref 70–99)
Glucose-Capillary: 322 mg/dL — ABNORMAL HIGH (ref 70–99)

## 2023-02-01 SURGERY — COLONOSCOPY
Anesthesia: General

## 2023-02-01 MED ORDER — INSULIN ASPART 100 UNIT/ML IJ SOLN
INTRAMUSCULAR | Status: AC
  Filled 2023-02-01: qty 1

## 2023-02-01 MED ORDER — PROPOFOL 10 MG/ML IV BOLUS
INTRAVENOUS | Status: DC | PRN
  Administered 2023-02-01: 20 mg via INTRAVENOUS
  Administered 2023-02-01: 40 mg via INTRAVENOUS
  Administered 2023-02-01 (×3): 20 mg via INTRAVENOUS
  Administered 2023-02-01: 40 mg via INTRAVENOUS
  Administered 2023-02-01: 20 mg via INTRAVENOUS

## 2023-02-01 MED ORDER — SODIUM CHLORIDE 0.9 % IV SOLN: INTRAVENOUS | Status: DC

## 2023-02-01 MED ORDER — LIDOCAINE HCL (PF) 2 % IJ SOLN
INTRAMUSCULAR | Status: DC | PRN
  Administered 2023-02-01: 30 mg via INTRADERMAL

## 2023-02-01 MED ORDER — INSULIN ASPART 100 UNIT/ML IJ SOLN
15.0000 [IU] | Freq: Once | INTRAMUSCULAR | Status: AC
  Administered 2023-02-01: 15 [IU] via SUBCUTANEOUS

## 2023-02-01 NOTE — Transfer of Care (Signed)
Immediate Anesthesia Transfer of Care Note  Patient: Martha Singleton  Procedure(s) Performed: COLONOSCOPY  Patient Location: PACU and Endoscopy Unit  Anesthesia Type:MAC  Level of Consciousness: drowsy  Airway & Oxygen Therapy: Patient Spontanous Breathing and Patient connected to nasal cannula oxygen  Post-op Assessment: Report given to RN and Post -op Vital signs reviewed and stable  Post vital signs: Reviewed and stable  Last Vitals:  Vitals Value Taken Time  BP 115/70 02/01/23 1457  Temp 36.8 C 02/01/23 1456  Pulse 69 02/01/23 1457  Resp 22 02/01/23 1457  SpO2 93 % 02/01/23 1457  Vitals shown include unvalidated device data.  Last Pain:  Vitals:   02/01/23 1456  TempSrc: Temporal         Complications: No notable events documented.

## 2023-02-01 NOTE — Anesthesia Preprocedure Evaluation (Signed)
Anesthesia Evaluation  Patient identified by MRN, date of birth, ID band Patient awake    Reviewed: Allergy & Precautions, H&P , NPO status , Patient's Chart, lab work & pertinent test results, reviewed documented beta blocker date and time   History of Anesthesia Complications (+) PONV and history of anesthetic complications  Airway Mallampati: III  TM Distance: <3 FB Neck ROM: full    Dental  (+) Caps, Chipped   Pulmonary shortness of breath and with exertion, asthma , neg sleep apnea, neg COPD, neg recent URI, former smoker   Pulmonary exam normal        Cardiovascular Exercise Tolerance: Good hypertension, (-) angina (-) CAD, (-) Past MI, (-) Cardiac Stents and (-) CABG Normal cardiovascular exam(-) dysrhythmias + Valvular Problems/Murmurs      Neuro/Psych  PSYCHIATRIC DISORDERS      negative neurological ROS     GI/Hepatic Neg liver ROS,GERD  ,,  Endo/Other  diabetes, Well Controlled, Type 2, Oral Hypoglycemic Agents    Renal/GU negative Renal ROS  negative genitourinary   Musculoskeletal   Abdominal Normal abdominal exam  (+)   Peds  Hematology negative hematology ROS (+)   Anesthesia Other Findings Patient's blood glucose was 396 today. States she hasn't take her medication for a few days because she was worried it was to low. Will treat with novolog. Discussed the need to restart her diabetes medications. Patient states she understood and agree and denies any symptoms of shortness of breath, chest pain, or lethargy.  Past Medical History: No date: Anxiety No date: Asthma No date: Depression 02/18/16: Diabetes mellitus type 2, diet-controlled (HCC)     Comment:  A1c 6.7 No date: GERD (gastroesophageal reflux disease) No date: Gout No date: Hyperlipidemia No date: Hypertension No date: Lichen sclerosus No date: Lumbago No date: Obesity No date: Osteopenia No date: Sinus bradycardia No date: Stress  incontinence   Reproductive/Obstetrics negative OB ROS                             Anesthesia Physical Anesthesia Plan  ASA: III  Anesthesia Plan: General   Post-op Pain Management: Minimal or no pain anticipated   Induction: Intravenous  PONV Risk Score and Plan: 4 or greater and Propofol infusion, TIVA, Treatment may vary due to age or medical condition and Ondansetron  Airway Management Planned: Nasal Cannula and Natural Airway  Additional Equipment: None  Intra-op Plan:   Post-operative Plan: Extubation in OR  Informed Consent: I have reviewed the patients History and Physical, chart, labs and discussed the procedure including the risks, benefits and alternatives for the proposed anesthesia with the patient or authorized representative who has indicated his/her understanding and acceptance.     Dental Advisory Given  Plan Discussed with: Anesthesiologist, CRNA and Surgeon  Anesthesia Plan Comments: (Discussed risks of anesthesia with patient, including possibility of difficulty with spontaneous ventilation under anesthesia necessitating airway intervention, PONV, and rare risks such as cardiac or respiratory or neurological events, and allergic reactions. Discussed the role of CRNA in patient's perioperative care. Patient understands.)        Anesthesia Quick Evaluation

## 2023-02-01 NOTE — Interval H&P Note (Signed)
History and Physical Interval Note:  02/01/2023 2:29 PM  Martha Singleton  has presented today for surgery, with the diagnosis of History of colon polyps (Z86.010).  The various methods of treatment have been discussed with the patient and family. After consideration of risks, benefits and other options for treatment, the patient has consented to  Procedure(s) with comments: COLONOSCOPY (N/A) - REQUESTS PM INSURANCE TRANS TO BRING; SON WILL PICK UP as a surgical intervention.  The patient's history has been reviewed, patient examined, no change in status, stable for surgery.  I have reviewed the patient's chart and labs.  Questions were answered to the patient's satisfaction.     [place], [place]

## 2023-02-01 NOTE — Op Note (Signed)
[facility] Gastroenterology Patient Name: Martha DareJoan Dabbs Procedure Date: 02/01/2023 2:22 PM MRN: [id number] Account #: [id number] Date of Birth: 1946/08/25 Admit Type: Outpatient Age: [age] [facility] Gender: Female Note Status: Finalized Instrument Name: Nelda MarseilleColonscope [id number] Procedure:             Colonoscopy Indications:           Surveillance: Personal history of adenomatous polyps                         on last colonoscopy > 5 years ago Providers:             Royce Macadamiaeodoro K. Norma Fredricksonoledo MD, MD Referring MD:          Duane LopeJeffrey D. Judithann SheenSparks, MD (Referring MD) Medicines:             Propofol per Anesthesia Complications:         No immediate complications. Procedure:             Pre-Anesthesia Assessment:                        - The risks and benefits of the procedure and the                         sedation options and risks were discussed with the                         patient. All questions were answered and informed                         consent was obtained.                        - Patient identification and proposed procedure were                         verified prior to the procedure by the nurse. The                         procedure was verified in the procedure room.                        - ASA Grade Assessment: III - A patient with severe                         systemic disease.                        - After reviewing the risks and benefits, the patient                         was deemed in satisfactory condition to undergo the                         procedure.                        After obtaining informed consent, the colonoscope was  passed under direct vision. Throughout the procedure,                         the patient's blood pressure, pulse, and oxygen                         saturations were monitored continuously. The                         Colonoscope was introduced through the anus and                          advanced to the the cecum, identified by appendiceal                         orifice and ileocecal valve. The colonoscopy was                         performed without difficulty. The patient tolerated                         the procedure well. The quality of the bowel                         preparation was adequate. The ileocecal valve,                         appendiceal orifice, and rectum were photographed. Findings:      The perianal and digital rectal examinations were normal. Pertinent       negatives include normal sphincter tone and no palpable rectal lesions.      Non-bleeding internal hemorrhoids were found during retroflexion. The       hemorrhoids were Grade I (internal hemorrhoids that do not prolapse).      An 8 mm polyp was found in the cecum. The polyp was sessile. The polyp       was removed with a cold snare. Resection and retrieval were complete.      A 20 mm polyp was found in the sigmoid colon. The polyp was       pedunculated. The polyp was removed with a hot snare. Resection and       retrieval were complete.      The exam was otherwise without abnormality. Impression:            - Non-bleeding internal hemorrhoids.                        - One 8 mm polyp in the cecum, removed with a cold                         snare. Resected and retrieved.                        - One 20 mm polyp in the sigmoid colon, removed with a                         hot snare. Resected and retrieved.                        -  The examination was otherwise normal. Recommendation:        - Patient has a contact number available for                         emergencies. The signs and symptoms of potential                         delayed complications were discussed with the patient.                         Return to normal activities tomorrow. Written                         discharge instructions were provided to the patient.                        - Resume previous diet.                         - Continue present medications.                        - Repeat colonoscopy is recommended for surveillance.                         The colonoscopy date will be determined after                         pathology results from today's exam become available                         for review.                        - Return to GI office PRN.                        - The findings and recommendations were discussed with                         the patient. Procedure Code(s):     --- Professional ---                        [phone], Colonoscopy, flexible; with removal of                         tumor(s), polyp(s), or other lesion(s) by snare                         technique Diagnosis Code(s):     --- Professional ---                        K64.0, First degree hemorrhoids                        D12.5, Benign neoplasm of sigmoid colon                        D12.0, Benign neoplasm of cecum  Z86.010, Personal history of colonic polyps CPT copyright 2022 American [employer]. All rights reserved. The codes documented in this report are preliminary and upon coder review may  be revised to meet current compliance requirements. Stanton Kidney MD, MD 02/01/2023 2:57:56 PM This report has been signed electronically. Number of Addenda: 0 Note Initiated On: 02/01/2023 2:22 PM Scope Withdrawal Time: 0 hours 8 minutes 26 seconds  Total Procedure Duration: 0 hours 11 minutes 41 seconds  Estimated Blood Loss:  Estimated blood loss: none.      [facility]

## 2023-02-01 NOTE — H&P (Signed)
Outpatient short stay form Pre-procedure 02/01/2023 2:27 PM Makenzey Nanni K. Norma Fredrickson, M.D.  Primary Physician: Aram Beecham, M.D.  Reason for visit:  Personal history of adenomatous colon polyps  History of present illness:  77 y/o female presents for surveillance colonoscopy for a hx of TA polyp. Colonoscopy 08/2017--4 mm polyp ascending. Path w/ TA. 5 year repeat recommended. Other than mild constipation, patient denies abdominal pain, hematochezia or involuntary weight loss.    Current Facility-Administered Medications:    0.9 %  sodium chloride infusion, , Intravenous, Continuous, [place], Boykin Nearing, MD, [place] at 02/01/23 1412  Medications Prior to Admission  Medication Sig Dispense Refill Last Dose   albuterol (PROVENTIL HFA;VENTOLIN HFA) 108 (90 BASE) MCG/ACT inhaler Inhale 2 puffs into the lungs every 6 (six) hours as needed for wheezing or shortness of breath. Reported on 02/18/2016   Past Week   amLODipine (NORVASC) 10 MG tablet Take 1 tablet (10 mg total) by mouth daily. 90 tablet 0 02/01/2023   atorvastatin (LIPITOR) 20 MG tablet TAKE 1 TABLET AT BEDTIME (PLEASE CALL TO MAKE AN APPOINTMENT) (Patient taking differently: Take 20 mg by mouth daily.) 90 tablet 0 01/31/2023   B Complex Vitamins (VITAMIN-B COMPLEX) TABS Take 1 tablet by mouth at bedtime.    Past Week   carvedilol (COREG) 3.125 MG tablet Take 1 tablet (3.125 mg total) by mouth 2 (two) times daily. 60 tablet 11 02/01/2023   glimepiride (AMARYL) 2 MG tablet Take 2 mg by mouth daily with breakfast.   Past Week   meloxicam (MOBIC) 7.5 MG tablet Take 7.5 mg by mouth daily.   Past Week   omeprazole (PRILOSEC) 20 MG capsule TAKE 1 CAPSULE ONE TIME DAILY AS NEEDED (Patient taking differently: TAKE 1 CAPSULE ONE TIME DAILY) 90 capsule 1 02/01/2023   telmisartan (MICARDIS) 80 MG tablet Take 80 mg by mouth daily.    02/01/2023   vitamin E 400 UNIT capsule Take 400 Units by mouth daily.    Past Week   ACCU-CHEK AVIVA PLUS test strip        acetaminophen (TYLENOL) 500 MG tablet Take 1,000 mg by mouth every 8 (eight) hours as needed (for pain/headaches.).      aspirin 81 MG chewable tablet Chew 81 mg by mouth daily.   01/30/2023   Cholecalciferol (VITAMIN D3) 2000 units TABS Take 2,000 Units by mouth at bedtime.      clobetasol ointment (TEMOVATE) 0.05 % Apply 1 application topically 2 (two) times daily as needed (for skin irritation).       dimenhyDRINATE (DRAMAMINE) 50 MG tablet       gabapentin (NEURONTIN) 100 MG capsule Take 1 capsule (100 mg total) by mouth 3 (three) times daily. (Patient not taking: Reported on 02/01/2023) 90 capsule 0 Not Taking   hydrochlorothiazide (HYDRODIURIL) 25 MG tablet Take 25 mg by mouth daily.      HYDROcodone-acetaminophen (NORCO/VICODIN) 5-325 MG tablet Take 1-2 tablets by mouth every 4 (four) hours as needed for moderate pain. 30 tablet 0    hydrocortisone 2.5 % cream Apply 1 application topically 2 (two) times daily as needed (for skin irritation/discomfort).      ibuprofen (ADVIL,MOTRIN) 200 MG tablet Take 400 mg by mouth every 8 (eight) hours as needed (for pain.).      ketoconazole (NIZORAL) 2 % shampoo APPLY 1 APPLICATION TOPICALLY AS DIRECTED. APPLY TO SCALP AND LET SIT SEVERAL MINUTES BEFORE RINSING. 120 mL 3    traZODone (DESYREL) 50 MG tablet Take 50 mg by mouth  at bedtime.        Allergies  Allergen Reactions   Benazepril Other (See Comments)    laryngospasm   Hydralazine Other (See Comments)    Chest pain   Atenolol Other (See Comments)    Patient is unsure of reaction to medication   Cefdinir Nausea And Vomiting    Omnicef   Flexeril [Cyclobenzaprine] Other (See Comments)    Dry mouth, urinary issues   Hydrochlorothiazide Other (See Comments)    Gout     Past Medical History:  Diagnosis Date   Anxiety    Asthma    Complication of anesthesia    nausea and vomiting    Depression    Diabetes mellitus type 2, diet-controlled 02/18/16   A1c 6.7   Dyspnea    Dysrhythmia     GERD (gastroesophageal reflux disease)    Gout    Hyperlipidemia    Hypertension    Lichen sclerosus    Lumbago    Obesity    Osteopenia    Sinus bradycardia    Stress incontinence     Review of systems:  Otherwise negative.    Physical Exam  Gen: Alert, oriented. Appears stated age.  HEENT: [place]/AT. PERRLA. Lungs: CTA, no wheezes. CV: RR nl S1, S2. Abd: soft, benign, no masses. BS+ Ext: No edema. Pulses 2+    Planned procedures: Proceed with colonoscopy. The patient understands the nature of the planned procedure, indications, risks, alternatives and potential complications including but not limited to bleeding, infection, perforation, damage to internal organs and possible oversedation/side effects from anesthesia. The patient agrees and gives consent to proceed.  Please refer to procedure notes for findings, recommendations and patient disposition/instructions.     Dustin Burrill K. Norma Fredrickson, M.D. Gastroenterology 02/01/2023  2:27 PM

## 2023-02-02 ENCOUNTER — Encounter: Payer: Self-pay | Admitting: Internal Medicine

## 2023-02-02 NOTE — Anesthesia Postprocedure Evaluation (Signed)
Anesthesia Post Note  Patient: RAMAN FELTUS  Procedure(s) Performed: COLONOSCOPY  Patient location during evaluation: Endoscopy Anesthesia Type: General Level of consciousness: awake and alert Pain management: pain level controlled Vital Signs Assessment: post-procedure vital signs reviewed and stable Respiratory status: spontaneous breathing, nonlabored ventilation, respiratory function stable and patient connected to nasal cannula oxygen Cardiovascular status: blood pressure returned to baseline and stable Postop Assessment: no apparent nausea or vomiting Anesthetic complications: no   No notable events documented.   Last Vitals:  Vitals:   02/01/23 1456 02/01/23 1515  BP: 115/70 (!) 152/63  Pulse:    Resp:    Temp: 36.8 C   SpO2:      Last Pain:  Vitals:   02/02/23 0802  TempSrc:   PainSc: 2                  Stephanie Coup

## 2023-02-03 LAB — SURGICAL PATHOLOGY

## 2023-02-08 DIAGNOSIS — Z961 Presence of intraocular lens: Secondary | ICD-10-CM | POA: Diagnosis not present

## 2023-02-08 DIAGNOSIS — H10502 Unspecified blepharoconjunctivitis, left eye: Secondary | ICD-10-CM | POA: Diagnosis not present

## 2023-02-08 DIAGNOSIS — R6889 Other general symptoms and signs: Secondary | ICD-10-CM | POA: Diagnosis not present

## 2023-02-08 DIAGNOSIS — H35341 Macular cyst, hole, or pseudohole, right eye: Secondary | ICD-10-CM | POA: Diagnosis not present

## 2023-02-08 DIAGNOSIS — M3501 Sicca syndrome with keratoconjunctivitis: Secondary | ICD-10-CM | POA: Diagnosis not present

## 2023-02-09 DIAGNOSIS — E118 Type 2 diabetes mellitus with unspecified complications: Secondary | ICD-10-CM | POA: Diagnosis not present

## 2023-02-09 DIAGNOSIS — Z79899 Other long term (current) drug therapy: Secondary | ICD-10-CM | POA: Diagnosis not present

## 2023-02-09 DIAGNOSIS — I10 Essential (primary) hypertension: Secondary | ICD-10-CM | POA: Diagnosis not present

## 2023-02-09 DIAGNOSIS — R8290 Unspecified abnormal findings in urine: Secondary | ICD-10-CM | POA: Diagnosis not present

## 2023-02-09 DIAGNOSIS — E782 Mixed hyperlipidemia: Secondary | ICD-10-CM | POA: Diagnosis not present

## 2023-02-16 DIAGNOSIS — E785 Hyperlipidemia, unspecified: Secondary | ICD-10-CM | POA: Diagnosis not present

## 2023-02-16 DIAGNOSIS — Z79899 Other long term (current) drug therapy: Secondary | ICD-10-CM | POA: Diagnosis not present

## 2023-02-16 DIAGNOSIS — N189 Chronic kidney disease, unspecified: Secondary | ICD-10-CM | POA: Diagnosis not present

## 2023-02-16 DIAGNOSIS — Z23 Encounter for immunization: Secondary | ICD-10-CM | POA: Diagnosis not present

## 2023-02-16 DIAGNOSIS — I129 Hypertensive chronic kidney disease with stage 1 through stage 4 chronic kidney disease, or unspecified chronic kidney disease: Secondary | ICD-10-CM | POA: Diagnosis not present

## 2023-02-16 DIAGNOSIS — Z0000 Encounter for general adult medical examination without abnormal findings: Secondary | ICD-10-CM | POA: Diagnosis not present

## 2023-02-16 DIAGNOSIS — E1122 Type 2 diabetes mellitus with diabetic chronic kidney disease: Secondary | ICD-10-CM | POA: Diagnosis not present

## 2023-02-20 ENCOUNTER — Telehealth: Payer: Self-pay | Admitting: Internal Medicine

## 2023-02-20 NOTE — Telephone Encounter (Signed)
Not Crissman's patient.

## 2023-02-20 NOTE — Telephone Encounter (Signed)
Copied from CRM [id number]. Topic: Medical Record Request - Patient ROI Request >> Feb 17, 2023  2:32 PM Marlow Baars wrote: Reason for CRM: The patient would like to request a copy of her vaccination records. Please assist patient further

## 2023-02-22 ENCOUNTER — Ambulatory Visit: Payer: Medicare HMO | Admitting: Family Medicine

## 2023-03-01 ENCOUNTER — Encounter: Payer: Self-pay | Admitting: Internal Medicine

## 2023-04-11 ENCOUNTER — Telehealth: Payer: Self-pay

## 2023-04-11 NOTE — Telephone Encounter (Signed)
Copied from CRM [id number]. Topic: General - Other >> Mar 31, 2023  2:18 PM Carrielelia G wrote: Reason for CRM:   patient would like a copy of her shot record and clarification is needed in regards to her pneumonia shot , date needed, too >> Apr 11, 2023  9:53 AM Geoffry Paradise G wrote: It is showing in patient's chart on  11/10/2014, 12/16/2014, 06/11/2016, 12/07/2016 that she was a patient of Dr Vonita Moss.  Patient stated she was given the pneumonia shot around 2016. Please call patient and advise. Thank you .  >> Mar 31, 2023  4:37 PM Herbert Seta T wrote: Wrong practice

## 2023-05-10 DIAGNOSIS — I10 Essential (primary) hypertension: Secondary | ICD-10-CM | POA: Diagnosis not present

## 2023-05-10 DIAGNOSIS — E118 Type 2 diabetes mellitus with unspecified complications: Secondary | ICD-10-CM | POA: Diagnosis not present

## 2023-05-10 DIAGNOSIS — Z79899 Other long term (current) drug therapy: Secondary | ICD-10-CM | POA: Diagnosis not present

## 2023-05-15 DIAGNOSIS — R8290 Unspecified abnormal findings in urine: Secondary | ICD-10-CM | POA: Diagnosis not present

## 2023-05-17 DIAGNOSIS — N1831 Chronic kidney disease, stage 3a: Secondary | ICD-10-CM | POA: Diagnosis not present

## 2023-05-17 DIAGNOSIS — L4050 Arthropathic psoriasis, unspecified: Secondary | ICD-10-CM | POA: Diagnosis not present

## 2023-05-17 DIAGNOSIS — Z87891 Personal history of nicotine dependence: Secondary | ICD-10-CM | POA: Diagnosis not present

## 2023-05-17 DIAGNOSIS — Z79899 Other long term (current) drug therapy: Secondary | ICD-10-CM | POA: Diagnosis not present

## 2023-05-17 DIAGNOSIS — I129 Hypertensive chronic kidney disease with stage 1 through stage 4 chronic kidney disease, or unspecified chronic kidney disease: Secondary | ICD-10-CM | POA: Diagnosis not present

## 2023-05-17 DIAGNOSIS — E782 Mixed hyperlipidemia: Secondary | ICD-10-CM | POA: Diagnosis not present

## 2023-05-17 DIAGNOSIS — E118 Type 2 diabetes mellitus with unspecified complications: Secondary | ICD-10-CM | POA: Diagnosis not present

## 2023-06-27 ENCOUNTER — Telehealth: Payer: Self-pay | Admitting: Internal Medicine

## 2023-06-27 NOTE — Telephone Encounter (Signed)
Was sent our office in error.

## 2023-06-27 NOTE — Telephone Encounter (Signed)
Copied from CRM [id number]. Topic: General - Other >> Jun 27, 2023 11:12 AM [place] B wrote: Reason for CRM: pt called in wants copy of 2015 2016 of immunization records. She specifically , looking for pneumonia shots and booster

## 2023-08-03 DIAGNOSIS — E782 Mixed hyperlipidemia: Secondary | ICD-10-CM | POA: Diagnosis not present

## 2023-08-03 DIAGNOSIS — I6523 Occlusion and stenosis of bilateral carotid arteries: Secondary | ICD-10-CM | POA: Diagnosis not present

## 2023-08-03 DIAGNOSIS — I10 Essential (primary) hypertension: Secondary | ICD-10-CM | POA: Diagnosis not present

## 2023-08-11 DIAGNOSIS — E118 Type 2 diabetes mellitus with unspecified complications: Secondary | ICD-10-CM | POA: Diagnosis not present

## 2023-08-11 DIAGNOSIS — Z79899 Other long term (current) drug therapy: Secondary | ICD-10-CM | POA: Diagnosis not present

## 2023-08-11 DIAGNOSIS — E782 Mixed hyperlipidemia: Secondary | ICD-10-CM | POA: Diagnosis not present

## 2023-08-12 DIAGNOSIS — Z8639 Personal history of other endocrine, nutritional and metabolic disease: Secondary | ICD-10-CM | POA: Diagnosis not present

## 2023-08-12 DIAGNOSIS — Z7960 Long term (current) use of unspecified immunomodulators and immunosuppressants: Secondary | ICD-10-CM | POA: Diagnosis not present

## 2023-08-12 DIAGNOSIS — R300 Dysuria: Secondary | ICD-10-CM | POA: Diagnosis not present

## 2023-08-12 DIAGNOSIS — N390 Urinary tract infection, site not specified: Secondary | ICD-10-CM | POA: Diagnosis not present

## 2023-08-16 DIAGNOSIS — I6523 Occlusion and stenosis of bilateral carotid arteries: Secondary | ICD-10-CM | POA: Diagnosis not present

## 2023-08-18 DIAGNOSIS — Z0000 Encounter for general adult medical examination without abnormal findings: Secondary | ICD-10-CM | POA: Diagnosis not present

## 2023-08-18 DIAGNOSIS — Z79899 Other long term (current) drug therapy: Secondary | ICD-10-CM | POA: Diagnosis not present

## 2023-08-18 DIAGNOSIS — I10 Essential (primary) hypertension: Secondary | ICD-10-CM | POA: Diagnosis not present

## 2023-08-18 DIAGNOSIS — L4050 Arthropathic psoriasis, unspecified: Secondary | ICD-10-CM | POA: Diagnosis not present

## 2023-08-18 DIAGNOSIS — E119 Type 2 diabetes mellitus without complications: Secondary | ICD-10-CM | POA: Diagnosis not present

## 2023-08-18 DIAGNOSIS — E785 Hyperlipidemia, unspecified: Secondary | ICD-10-CM | POA: Diagnosis not present

## 2023-08-21 DIAGNOSIS — H40003 Preglaucoma, unspecified, bilateral: Secondary | ICD-10-CM | POA: Diagnosis not present

## 2023-08-21 DIAGNOSIS — H40009 Preglaucoma, unspecified, unspecified eye: Secondary | ICD-10-CM | POA: Diagnosis not present

## 2023-08-21 DIAGNOSIS — Z961 Presence of intraocular lens: Secondary | ICD-10-CM | POA: Diagnosis not present

## 2023-08-21 DIAGNOSIS — E119 Type 2 diabetes mellitus without complications: Secondary | ICD-10-CM | POA: Diagnosis not present

## 2023-08-24 IMAGING — CR DG CHEST 2V
2 series · 2 of 2 positions shown · non-contrast
Comparison: None.

CLINICAL DATA: Chest pain

EXAM:
CHEST - 2 VIEW

[chest pa]
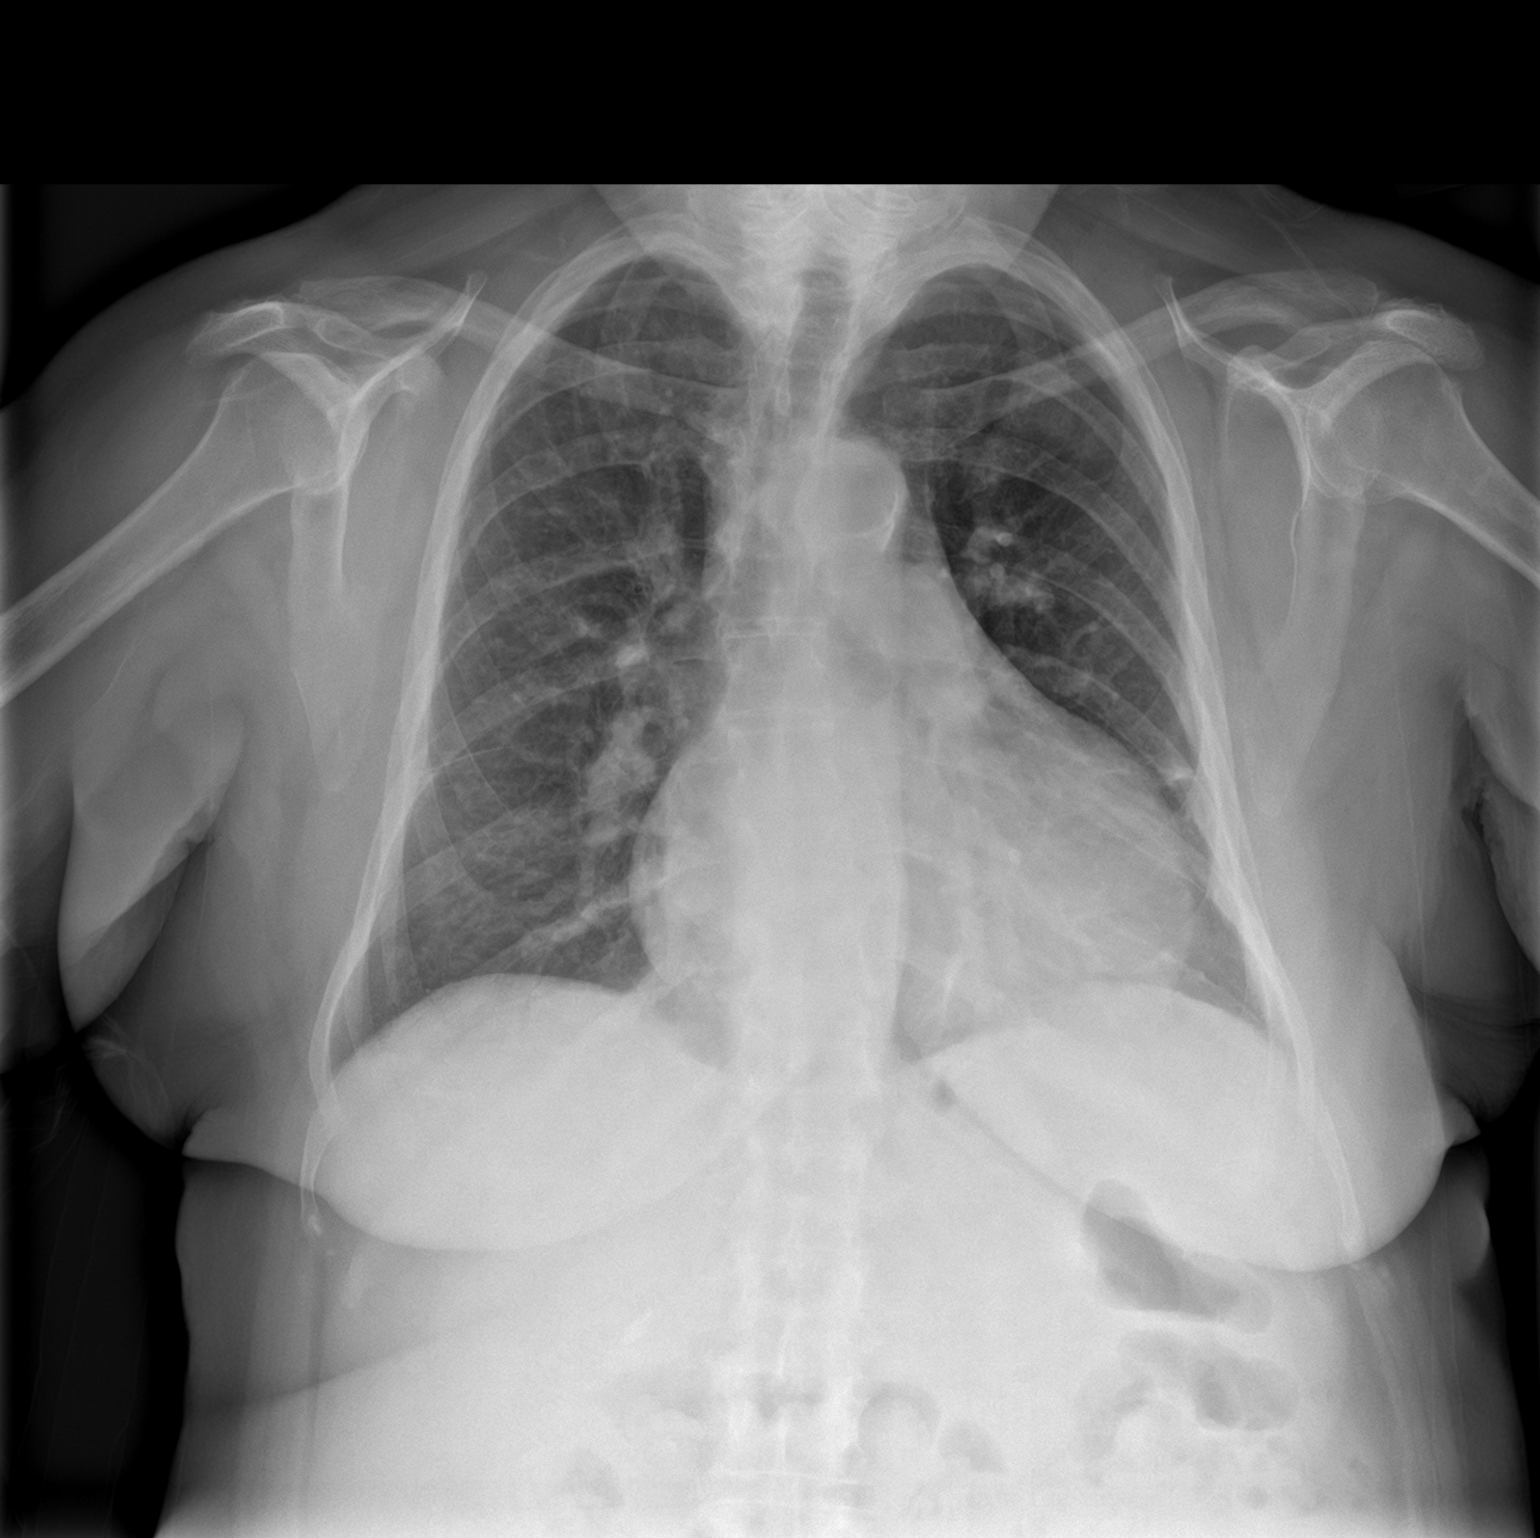

[chest lat]
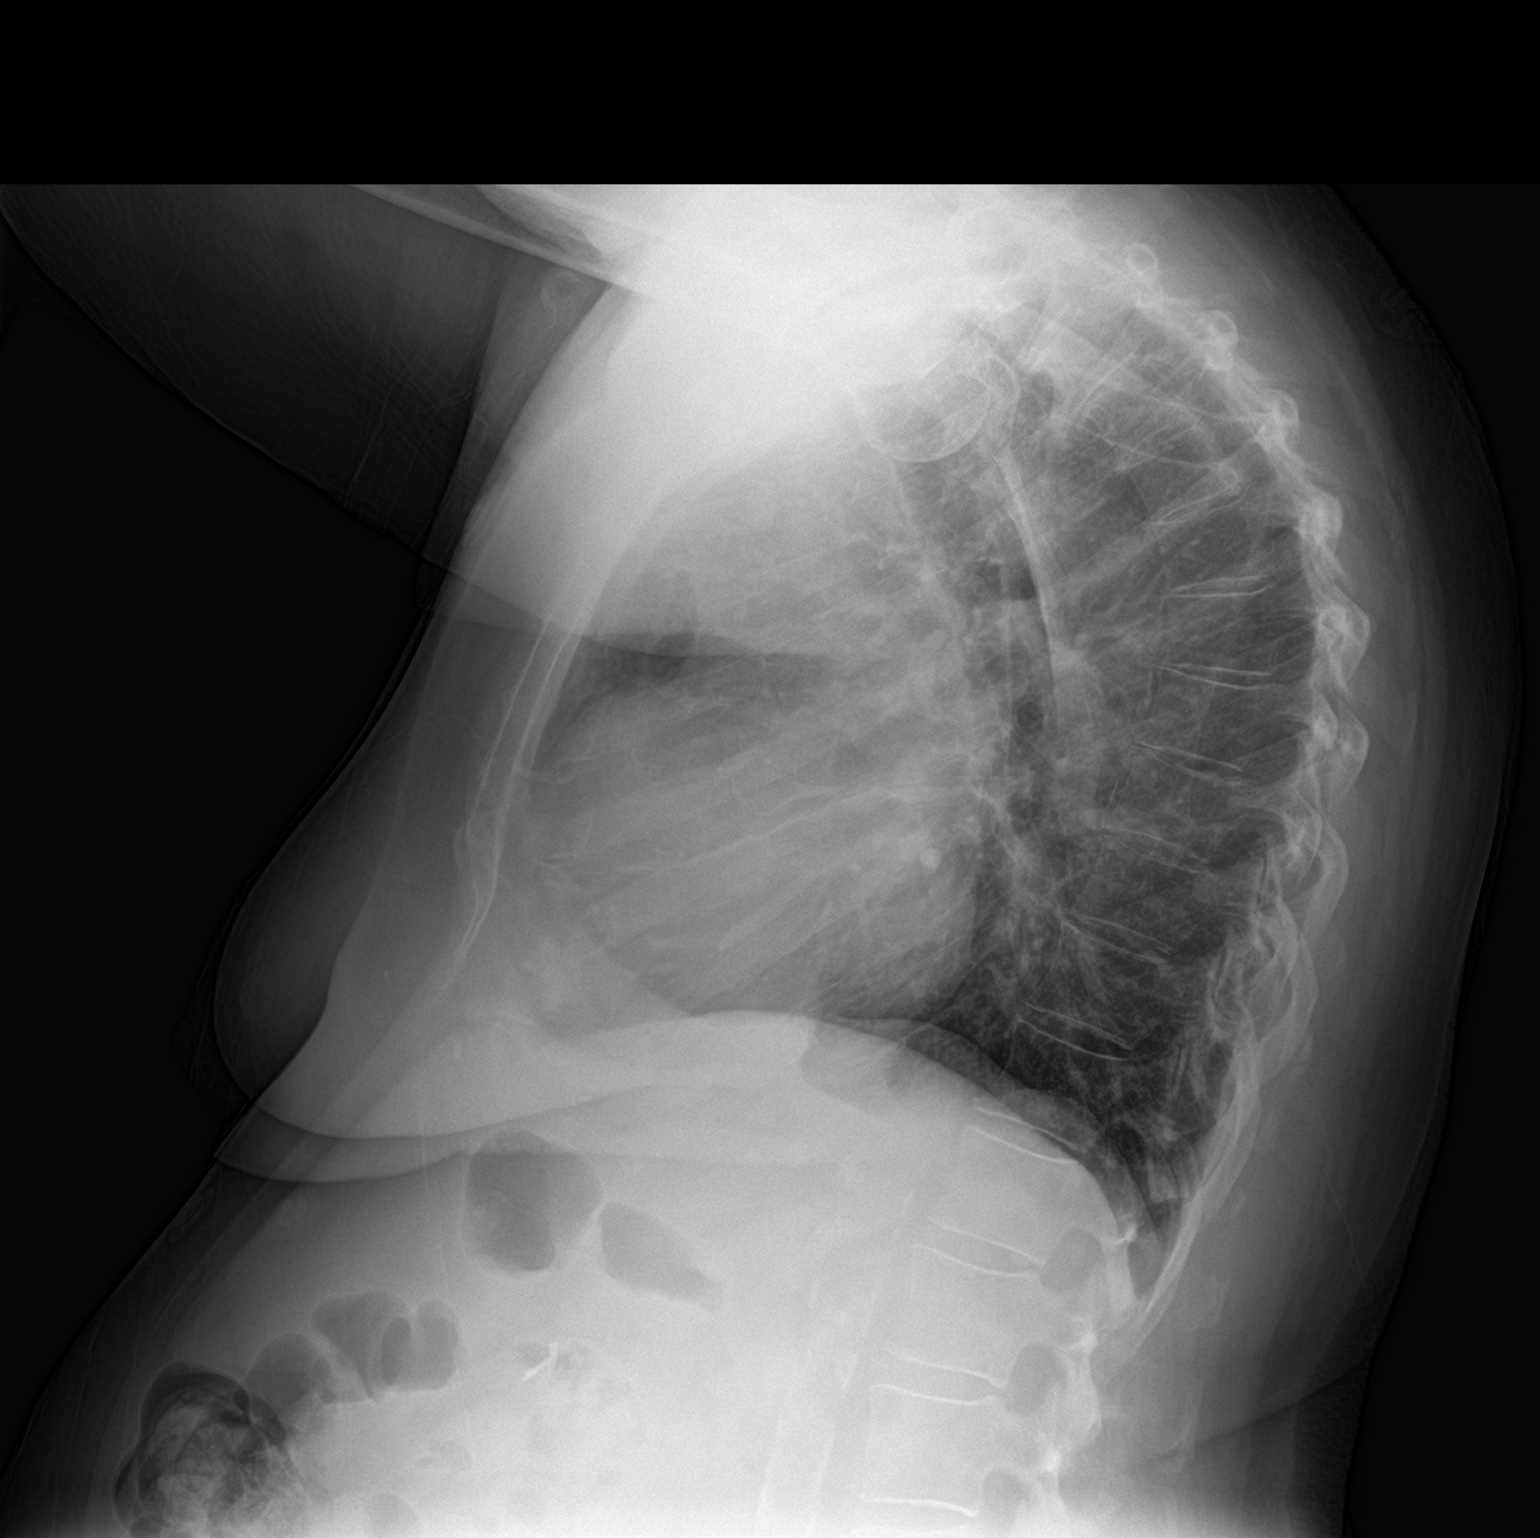

[2 of 2 positions shown; findings below may reference images not displayed]

FINDINGS: Mild lingular scarring. Right lung is clear. No pleural effusion or
pneumothorax.

Cardiomegaly.  Thoracic aortic atherosclerosis.

Visualized thoracolumbar spine is within normal limits.

Cholecystectomy clips.
IMPRESSION: Cardiomegaly.

Otherwise normal chest radiographs.

## 2023-08-24 IMAGING — CT CT HEAD W/O CM
3 series · 15 of 47 positions shown, 18 images · non-contrast
Comparison: 06/01/2011

CLINICAL DATA: Diplopia, headache

EXAM:
CT HEAD WITHOUT CONTRAST
TECHNIQUE: Contiguous axial images were obtained from the base of the skull
through the vertex without intravenous contrast.

[Series 2: head wo · axial · 0.43mm/px · z∈[-62,+73]mm · 9 of 33 slices shown, 12 images]
[im 3/33  brain]
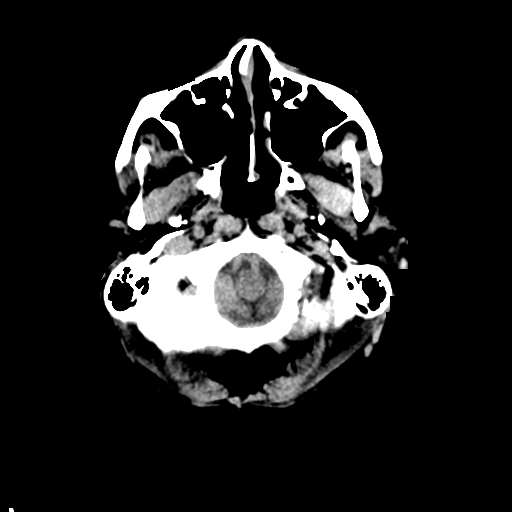
[im 3/33  bone]
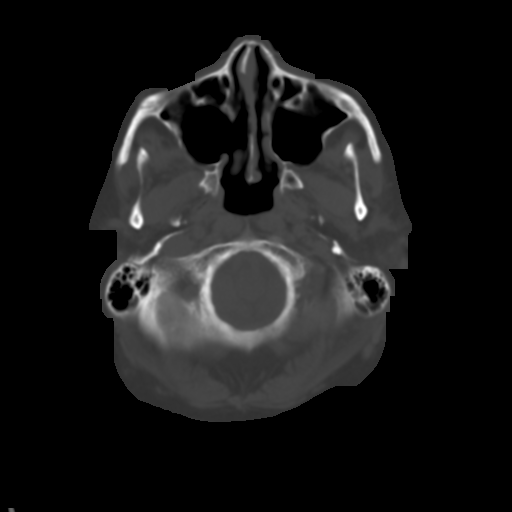
[im 6/33  brain]
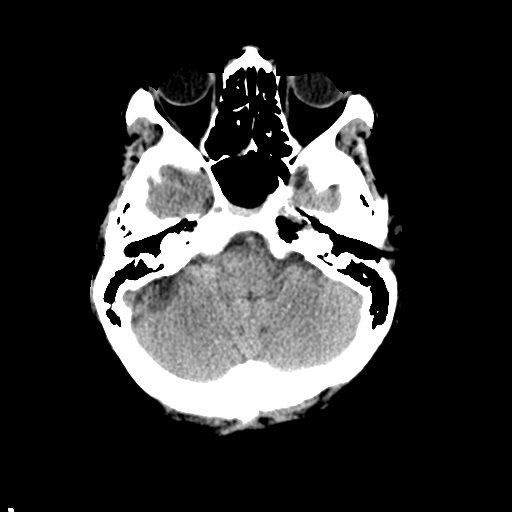
[im 9/33  brain]
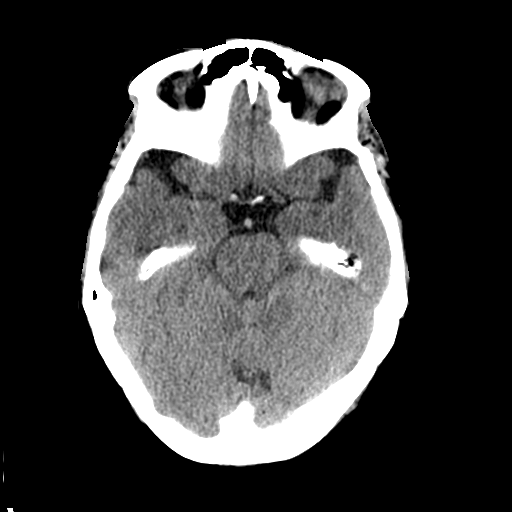
[im 13/33  brain]
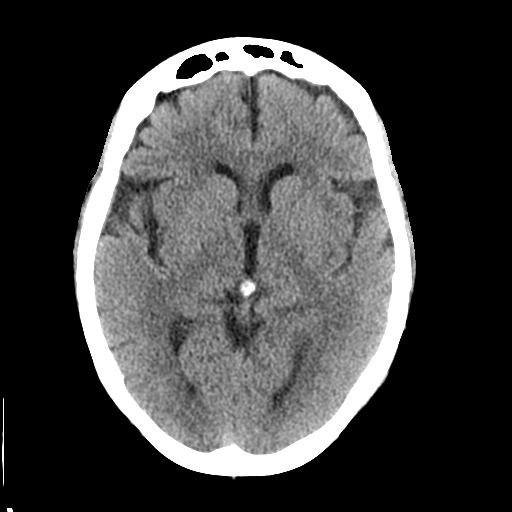
[im 17/33  brain]
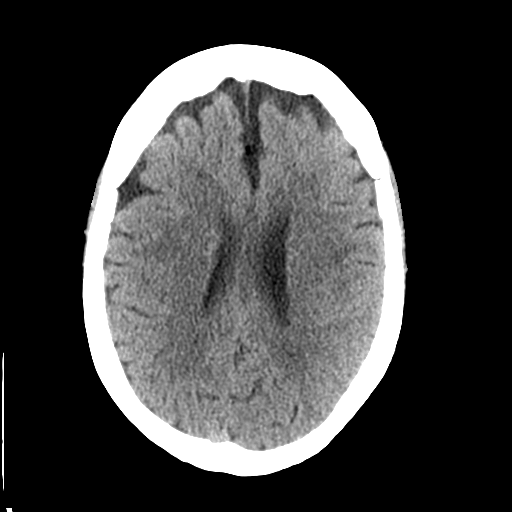
[im 17/33  bone]
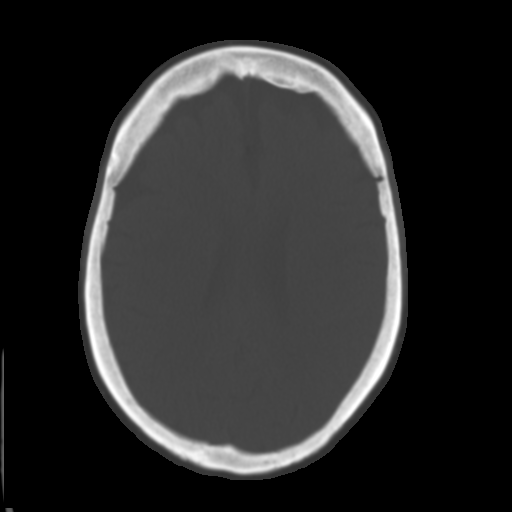
[im 20/33  brain]
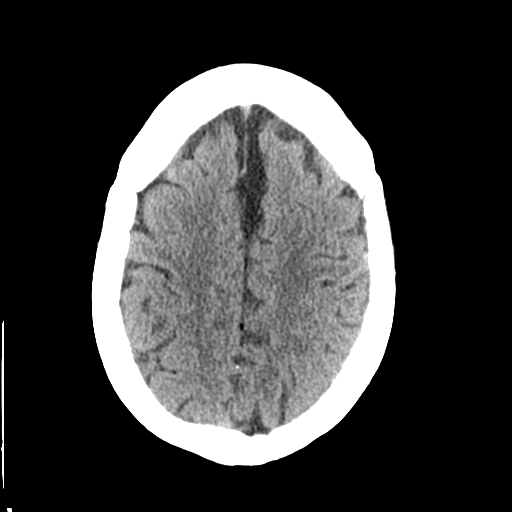
[im 24/33  brain]
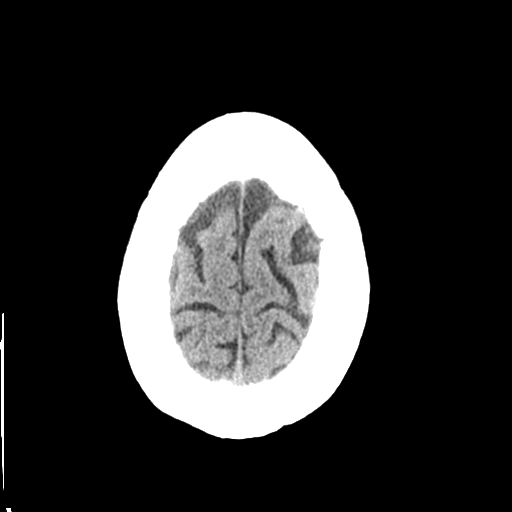
[im 27/33  brain]
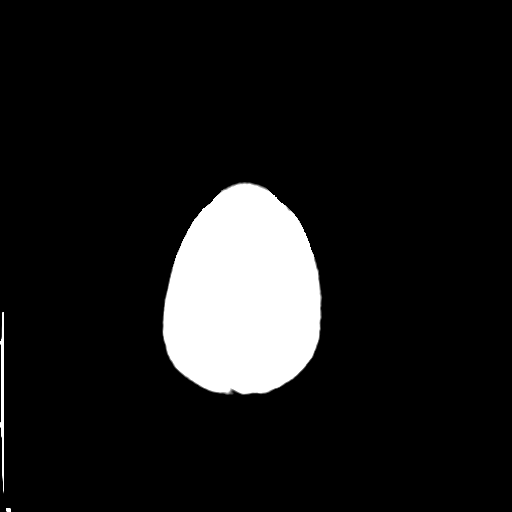
[im 30/33  brain]
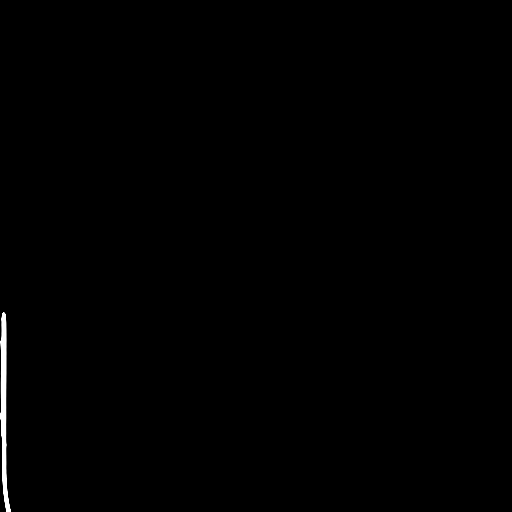
[im 30/33  bone]
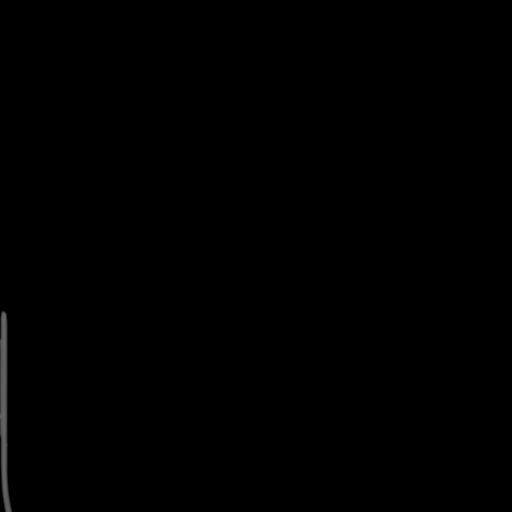

[Series 4: coronal soft tissue · coronal · 0.30mm/px · 3 of 67 slices shown]
[im 23/67  brain]
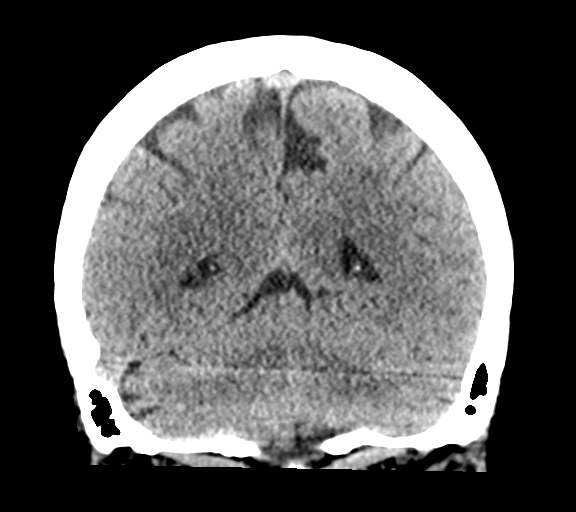
[im 30/67  brain]
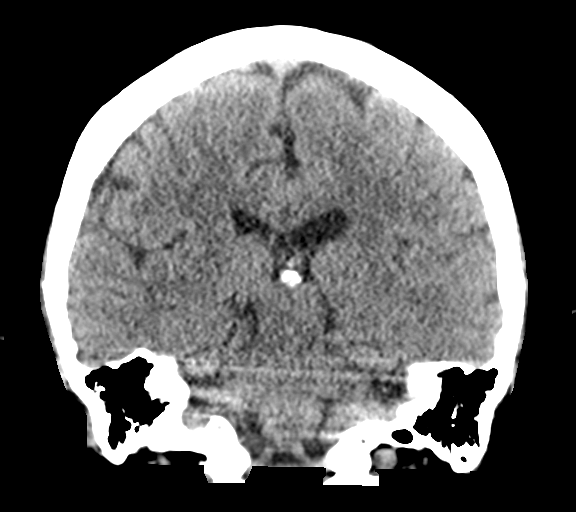
[im 37/67  brain]
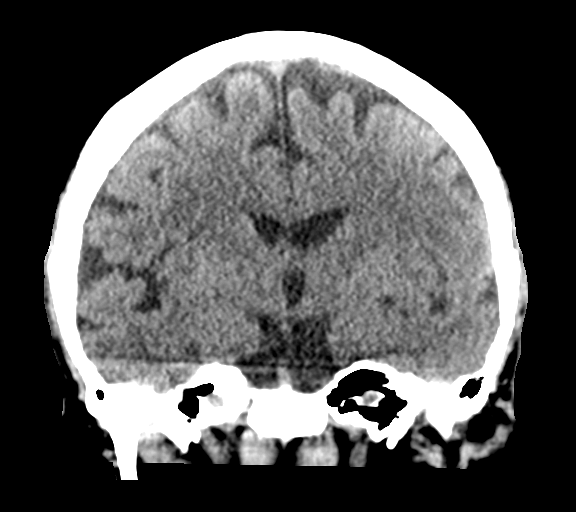

[Series 5: sagittal soft tissue · sagittal · 0.31mm/px · 3 of 59 slices shown]
[im 20/59  brain]
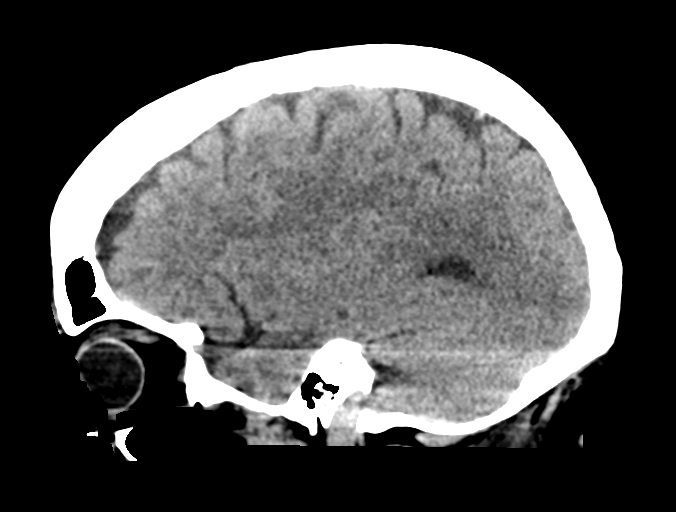
[im 30/59  brain]
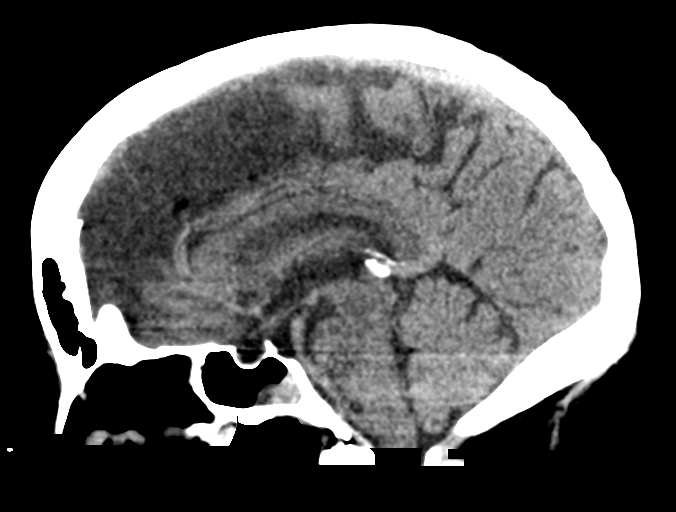
[im 39/59  brain]
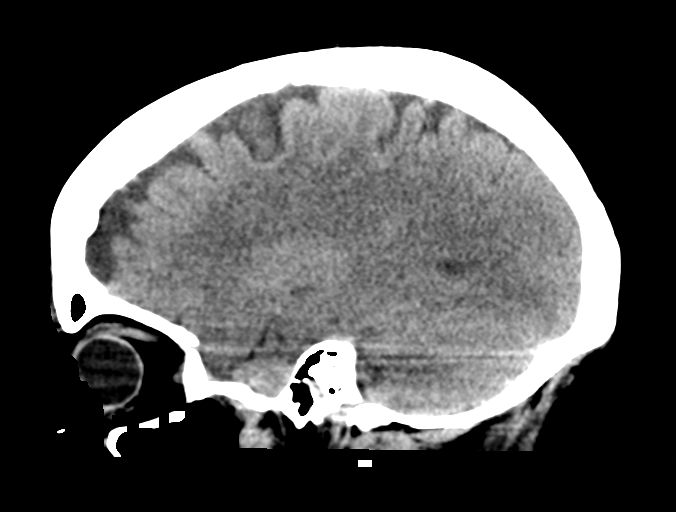

[15 of 47 positions shown; findings below may reference images not displayed]

FINDINGS: Brain: Normal anatomic configuration. Parenchymal volume loss is
commensurate with the patient's age. Mild periventricular white
matter changes are present likely reflecting the sequela of small
vessel ischemia. No abnormal intra or extra-axial mass lesion or
fluid collection. No abnormal mass effect or midline shift. No
evidence of acute intracranial hemorrhage or infarct. Ventricular
size is normal. Cerebellum unremarkable.

Vascular: No asymmetric hyperdense vasculature at the skull base.

Skull: Intact

Sinuses/Orbits: Layering mucus is seen within the right sphenoid
sinus. Right maxillary antrostomy and bilateral middle turbinectomy
has been performed. There is moderate mucosal thickening noted
within the nasal passages. Remaining paranasal sinuses are clear.
Orbits are unremarkable.

Other: Mastoid air cells and middle ear cavities are clear.
IMPRESSION: No acute intracranial abnormality.  Mild senescent change.

Mild to moderate paranasal sinus disease.

## 2023-09-25 ENCOUNTER — Other Ambulatory Visit: Payer: Self-pay | Admitting: Internal Medicine

## 2023-09-25 DIAGNOSIS — I6522 Occlusion and stenosis of left carotid artery: Secondary | ICD-10-CM

## 2023-09-26 DIAGNOSIS — Z1231 Encounter for screening mammogram for malignant neoplasm of breast: Secondary | ICD-10-CM | POA: Diagnosis not present

## 2023-09-28 DIAGNOSIS — H524 Presbyopia: Secondary | ICD-10-CM | POA: Diagnosis not present

## 2023-10-10 ENCOUNTER — Inpatient Hospital Stay
Admission: RE | Admit: 2023-10-10 | Discharge: 2023-10-10 | Disposition: A | Discharge status: Home / Self Care | Payer: Self-pay | Source: Ambulatory Visit | Attending: Internal Medicine | Admitting: Internal Medicine

## 2023-10-10 ENCOUNTER — Other Ambulatory Visit: Payer: Self-pay | Admitting: *Deleted

## 2023-10-10 ENCOUNTER — Other Ambulatory Visit: Payer: Self-pay | Admitting: Internal Medicine

## 2023-10-10 DIAGNOSIS — Z1231 Encounter for screening mammogram for malignant neoplasm of breast: Secondary | ICD-10-CM

## 2023-10-10 DIAGNOSIS — N6489 Other specified disorders of breast: Secondary | ICD-10-CM

## 2023-10-31 ENCOUNTER — Ambulatory Visit: Admission: RE | Admit: 2023-10-31 | Payer: Medicare HMO | Source: Ambulatory Visit

## 2023-11-01 ENCOUNTER — Ambulatory Visit
Admission: RE | Admit: 2023-11-01 | Discharge: 2023-11-01 | Disposition: A | Discharge status: Home / Self Care | Payer: Medicare HMO | Source: Ambulatory Visit | Attending: Internal Medicine | Admitting: Internal Medicine

## 2023-11-01 DIAGNOSIS — D369 Benign neoplasm, unspecified site: Secondary | ICD-10-CM | POA: Diagnosis not present

## 2023-11-01 DIAGNOSIS — N6489 Other specified disorders of breast: Secondary | ICD-10-CM | POA: Diagnosis not present

## 2023-11-01 DIAGNOSIS — R928 Other abnormal and inconclusive findings on diagnostic imaging of breast: Secondary | ICD-10-CM | POA: Diagnosis not present

## 2023-11-01 DIAGNOSIS — N6321 Unspecified lump in the left breast, upper outer quadrant: Secondary | ICD-10-CM | POA: Diagnosis not present

## 2023-11-01 DIAGNOSIS — R92322 Mammographic fibroglandular density, left breast: Secondary | ICD-10-CM | POA: Diagnosis not present

## 2023-11-22 DIAGNOSIS — E010 Iodine-deficiency related diffuse (endemic) goiter: Secondary | ICD-10-CM | POA: Diagnosis not present

## 2023-11-22 DIAGNOSIS — I708 Atherosclerosis of other arteries: Secondary | ICD-10-CM | POA: Diagnosis not present

## 2023-11-22 DIAGNOSIS — G319 Degenerative disease of nervous system, unspecified: Secondary | ICD-10-CM | POA: Diagnosis not present

## 2024-01-03 DIAGNOSIS — R300 Dysuria: Secondary | ICD-10-CM | POA: Diagnosis not present

## 2024-01-03 DIAGNOSIS — S8992XA Unspecified injury of left lower leg, initial encounter: Secondary | ICD-10-CM | POA: Diagnosis not present

## 2024-01-03 DIAGNOSIS — N10 Acute tubulo-interstitial nephritis: Secondary | ICD-10-CM | POA: Diagnosis not present

## 2024-01-03 DIAGNOSIS — Z03818 Encounter for observation for suspected exposure to other biological agents ruled out: Secondary | ICD-10-CM | POA: Diagnosis not present

## 2024-02-19 DIAGNOSIS — B880 Other acariasis: Secondary | ICD-10-CM | POA: Diagnosis not present

## 2024-02-19 DIAGNOSIS — H01003 Unspecified blepharitis right eye, unspecified eyelid: Secondary | ICD-10-CM | POA: Diagnosis not present

## 2024-06-06 DIAGNOSIS — E785 Hyperlipidemia, unspecified: Secondary | ICD-10-CM | POA: Diagnosis not present

## 2024-06-06 DIAGNOSIS — Z0000 Encounter for general adult medical examination without abnormal findings: Secondary | ICD-10-CM | POA: Diagnosis not present

## 2024-06-06 DIAGNOSIS — Z79899 Other long term (current) drug therapy: Secondary | ICD-10-CM | POA: Diagnosis not present

## 2024-06-06 DIAGNOSIS — L4050 Arthropathic psoriasis, unspecified: Secondary | ICD-10-CM | POA: Diagnosis not present

## 2024-06-06 DIAGNOSIS — E119 Type 2 diabetes mellitus without complications: Secondary | ICD-10-CM | POA: Diagnosis not present

## 2024-06-06 DIAGNOSIS — M109 Gout, unspecified: Secondary | ICD-10-CM | POA: Diagnosis not present

## 2024-06-06 DIAGNOSIS — I10 Essential (primary) hypertension: Secondary | ICD-10-CM | POA: Diagnosis not present

## 2024-06-06 DIAGNOSIS — E782 Mixed hyperlipidemia: Secondary | ICD-10-CM | POA: Diagnosis not present

## 2024-06-06 DIAGNOSIS — E118 Type 2 diabetes mellitus with unspecified complications: Secondary | ICD-10-CM | POA: Diagnosis not present

## 2024-06-07 DIAGNOSIS — E118 Type 2 diabetes mellitus with unspecified complications: Secondary | ICD-10-CM | POA: Diagnosis not present

## 2024-06-07 DIAGNOSIS — E782 Mixed hyperlipidemia: Secondary | ICD-10-CM | POA: Diagnosis not present

## 2024-06-07 DIAGNOSIS — I10 Essential (primary) hypertension: Secondary | ICD-10-CM | POA: Diagnosis not present

## 2024-06-07 DIAGNOSIS — Z79899 Other long term (current) drug therapy: Secondary | ICD-10-CM | POA: Diagnosis not present

## 2024-06-11 DIAGNOSIS — R8290 Unspecified abnormal findings in urine: Secondary | ICD-10-CM | POA: Diagnosis not present

## 2024-06-21 DIAGNOSIS — R399 Unspecified symptoms and signs involving the genitourinary system: Secondary | ICD-10-CM | POA: Diagnosis not present

## 2024-06-21 DIAGNOSIS — N390 Urinary tract infection, site not specified: Secondary | ICD-10-CM | POA: Diagnosis not present

## 2024-06-26 ENCOUNTER — Ambulatory Visit: Admitting: Family Medicine

## 2024-07-09 DIAGNOSIS — R399 Unspecified symptoms and signs involving the genitourinary system: Secondary | ICD-10-CM | POA: Diagnosis not present

## 2024-07-19 DIAGNOSIS — J9801 Acute bronchospasm: Secondary | ICD-10-CM | POA: Diagnosis not present

## 2024-07-19 DIAGNOSIS — J189 Pneumonia, unspecified organism: Secondary | ICD-10-CM | POA: Diagnosis not present

## 2024-07-19 DIAGNOSIS — Z03818 Encounter for observation for suspected exposure to other biological agents ruled out: Secondary | ICD-10-CM | POA: Diagnosis not present

## 2024-07-19 DIAGNOSIS — R509 Fever, unspecified: Secondary | ICD-10-CM | POA: Diagnosis not present

## 2024-07-19 DIAGNOSIS — R051 Acute cough: Secondary | ICD-10-CM | POA: Diagnosis not present

## 2024-08-08 DIAGNOSIS — J189 Pneumonia, unspecified organism: Secondary | ICD-10-CM | POA: Diagnosis not present

## 2024-08-08 DIAGNOSIS — Z87440 Personal history of urinary (tract) infections: Secondary | ICD-10-CM | POA: Diagnosis not present

## 2024-08-08 DIAGNOSIS — R531 Weakness: Secondary | ICD-10-CM | POA: Diagnosis not present

## 2024-08-08 DIAGNOSIS — Z201 Contact with and (suspected) exposure to tuberculosis: Secondary | ICD-10-CM | POA: Diagnosis not present

## 2024-08-12 ENCOUNTER — Other Ambulatory Visit: Payer: Self-pay | Admitting: Internal Medicine

## 2024-08-12 DIAGNOSIS — Z1231 Encounter for screening mammogram for malignant neoplasm of breast: Secondary | ICD-10-CM

## 2024-08-30 NOTE — Progress Notes (Signed)
 Martha Singleton                                          MRN: [id number]   08/30/2024   The [facility] Quality Team Specialist reviewed this patient medical record for the purposes of chart review for care gap closure. The following were reviewed: chart review for care gap closure-controlling blood pressure.    [facility] Quality Team

## 2024-09-11 DIAGNOSIS — I129 Hypertensive chronic kidney disease with stage 1 through stage 4 chronic kidney disease, or unspecified chronic kidney disease: Secondary | ICD-10-CM | POA: Diagnosis not present

## 2024-09-11 DIAGNOSIS — E782 Mixed hyperlipidemia: Secondary | ICD-10-CM | POA: Diagnosis not present

## 2024-09-11 DIAGNOSIS — E1122 Type 2 diabetes mellitus with diabetic chronic kidney disease: Secondary | ICD-10-CM | POA: Diagnosis not present

## 2024-09-11 DIAGNOSIS — R1310 Dysphagia, unspecified: Secondary | ICD-10-CM | POA: Diagnosis not present

## 2024-09-11 DIAGNOSIS — Z79899 Other long term (current) drug therapy: Secondary | ICD-10-CM | POA: Diagnosis not present

## 2024-09-11 DIAGNOSIS — N1831 Chronic kidney disease, stage 3a: Secondary | ICD-10-CM | POA: Diagnosis not present

## 2024-09-11 DIAGNOSIS — Z794 Long term (current) use of insulin: Secondary | ICD-10-CM | POA: Diagnosis not present

## 2024-09-11 DIAGNOSIS — Z1231 Encounter for screening mammogram for malignant neoplasm of breast: Secondary | ICD-10-CM | POA: Diagnosis not present

## 2024-09-11 DIAGNOSIS — Z87891 Personal history of nicotine dependence: Secondary | ICD-10-CM | POA: Diagnosis not present

## 2024-10-29 ENCOUNTER — Ambulatory Visit
Admission: RE | Admit: 2024-10-29 | Discharge: 2024-10-29 | Disposition: A | Discharge status: Home / Self Care | Source: Ambulatory Visit | Attending: Internal Medicine | Admitting: Internal Medicine

## 2024-10-29 DIAGNOSIS — Z1231 Encounter for screening mammogram for malignant neoplasm of breast: Secondary | ICD-10-CM | POA: Insufficient documentation

## 2024-11-12 ENCOUNTER — Ambulatory Visit: Admitting: Family Medicine
